# Patient Record
Sex: Female | Born: 1980
Health system: Southern US, Community
[De-identification: ages and names within clinical notes are randomized; demographics above are authoritative.]

## PROBLEM LIST (undated history)

## (undated) DIAGNOSIS — F32A Depression, unspecified: Secondary | ICD-10-CM

## (undated) DIAGNOSIS — F329 Major depressive disorder, single episode, unspecified: Secondary | ICD-10-CM

## (undated) DIAGNOSIS — R0981 Nasal congestion: Secondary | ICD-10-CM

---

## 2006-10-11 HISTORY — PX: WISDOM TOOTH EXTRACTION: SHX21

## 2014-02-15 ENCOUNTER — Encounter: Payer: Self-pay | Admitting: Physician Assistant

## 2014-02-15 ENCOUNTER — Ambulatory Visit (INDEPENDENT_AMBULATORY_CARE_PROVIDER_SITE_OTHER): Payer: BC Managed Care – PPO | Admitting: Physician Assistant

## 2014-02-15 VITALS — BP 116/79 | HR 68 | Ht 65.0 in | Wt 172.0 lb

## 2014-02-15 DIAGNOSIS — M545 Low back pain, unspecified: Secondary | ICD-10-CM

## 2014-02-15 DIAGNOSIS — R319 Hematuria, unspecified: Secondary | ICD-10-CM

## 2014-02-15 DIAGNOSIS — S39012A Strain of muscle, fascia and tendon of lower back, initial encounter: Secondary | ICD-10-CM

## 2014-02-15 LAB — POCT URINALYSIS DIPSTICK
BILIRUBIN UA: NEGATIVE
Glucose, UA: NEGATIVE
Ketones, UA: NEGATIVE
Leukocytes, UA: NEGATIVE
NITRITE UA: NEGATIVE
PH UA: 6.5
PROTEIN UA: NEGATIVE
Spec Grav, UA: 1.015
UROBILINOGEN UA: 0.2

## 2014-02-15 MED ORDER — MELOXICAM 15 MG PO TABS: 15.0000 mg | ORAL_TABLET | Freq: Every day | ORAL | Status: DC

## 2014-02-15 MED ORDER — CYCLOBENZAPRINE HCL 10 MG PO TABS: 10.0000 mg | ORAL_TABLET | Freq: Three times a day (TID) | ORAL | Status: DC | PRN

## 2014-02-15 MED ORDER — HYDROCODONE-ACETAMINOPHEN 5-300 MG PO TABS: ORAL_TABLET | ORAL | Status: DC

## 2014-02-15 MED ORDER — KETOROLAC TROMETHAMINE 60 MG/2ML IM SOLN
60.0000 mg | Freq: Once | INTRAMUSCULAR | Status: AC
  Administered 2014-02-15: 60 mg via INTRAMUSCULAR

## 2014-02-15 NOTE — Patient Instructions (Signed)

## 2014-02-15 NOTE — Progress Notes (Signed)
   Subjective:    Patient ID: Lisa Weeks, female    DOB: 11/06/1980, 33 y.o.   MRN: [id number]  HPI Pt is a 33 yo female who presents to the clinic to establish care.   PMH negative for any ongoing conditions and not on any medications.   Pt is a everyday smoker.   Approximately 7 days ago pt was playing in the floor with her legs spread wide playing with her 60 month old son. When she went to bend forward she felt a sharp pain go through her right side. It does not radiate into legs. Most of the time 4/10 pain scale except with movements. She went to [facility] next day and xrays were normal. Given steroid shot, oral prednisone and hydrocodone. 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 Worse when lifting or bending over. Better when sitting on heating pad. She has 3 boys and needs to be active.        Review of Systems  All other systems reviewed and are negative.      Objective:   Physical Exam  Constitutional: She is oriented to person, place, and time. She appears well-developed and well-nourished.  HENT:  Head: Normocephalic and atraumatic.  Cardiovascular: Normal rate, regular rhythm and normal heart sounds.   Pulmonary/Chest: Effort normal and breath sounds normal.  Musculoskeletal:  No pain over lumbar spine to palpation.  Pain and tightness with deep palpation over right lower back. ROM limited due to pain but able to perform all ROM exercises from left to right, extension and flexion.  Negative straight leg test.  Patellar reflexes 2+ and symmetric.  Strength 5/5 lower extremities.   Neurological: She is alert and oriented to person, place, and time.  Skin: Skin is dry.  Psychiatric: She has a normal mood and affect. Her behavior is normal.          Assessment & Plan:  Right low back pain-. Results for orders placed in visit on 02/15/14  URINE CULTURE      Result Value Ref Range   Colony Count 15,000 COLONIES/ML     Organism ID, Bacteria Multiple bacterial morphotypes present, none     Organism ID, Bacteria predominant. Suggest appropriate recollection if      Organism ID, Bacteria clinically indicated.    POCT URINALYSIS DIPSTICK      Result Value Ref Range   Color, UA yellow     Clarity, UA clear     Glucose, UA neg     Bilirubin, UA neg     Ketones, UA neg     Spec Grav, UA 1.015     Blood, UA trace-intact     pH, UA 6.5     Protein, UA neg     Urobilinogen, UA 0.2     Nitrite, UA neg     Leukocytes, UA Negative     Hematuria but pt is on her period. Will culture and was negative as well. Treated with low back strain exercises, mobic daily(advised to stop ibuprofen), flexeril as needed and hydrocodone for breakthrough pain. Follow up in 2-4 weeks. If not improving may need formal PT.  Pt does have yearly check ups at [facility]. She reports they have done blood work. Would like copy. If not done would like to get done here.

## 2014-02-17 LAB — URINE CULTURE: Colony Count: 15000

## 2014-03-26 ENCOUNTER — Encounter: Payer: Self-pay | Admitting: Physician Assistant

## 2014-03-26 ENCOUNTER — Ambulatory Visit (INDEPENDENT_AMBULATORY_CARE_PROVIDER_SITE_OTHER): Payer: BC Managed Care – PPO | Admitting: Physician Assistant

## 2014-03-26 VITALS — BP 122/77 | HR 74 | Temp 98.2°F | Ht 65.0 in | Wt 173.0 lb

## 2014-03-26 DIAGNOSIS — R05 Cough: Secondary | ICD-10-CM

## 2014-03-26 DIAGNOSIS — R059 Cough, unspecified: Secondary | ICD-10-CM

## 2014-03-26 DIAGNOSIS — J45909 Unspecified asthma, uncomplicated: Secondary | ICD-10-CM

## 2014-03-26 MED ORDER — ALBUTEROL SULFATE 108 (90 BASE) MCG/ACT IN AEPB: 2.0000 | INHALATION_SPRAY | RESPIRATORY_TRACT | Status: DC | PRN

## 2014-03-26 MED ORDER — ALBUTEROL SULFATE (2.5 MG/3ML) 0.083% IN NEBU
2.5000 mg | INHALATION_SOLUTION | Freq: Once | RESPIRATORY_TRACT | Status: AC
  Administered 2014-03-26: 2.5 mg via RESPIRATORY_TRACT

## 2014-03-26 MED ORDER — HYDROCODONE-HOMATROPINE 5-1.5 MG/5ML PO SYRP: 5.0000 mL | ORAL_SOLUTION | Freq: Every evening | ORAL | Status: DC | PRN

## 2014-03-26 MED ORDER — PREDNISONE 50 MG PO TABS: ORAL_TABLET | ORAL | Status: DC

## 2014-03-26 MED ORDER — AZITHROMYCIN 250 MG PO TABS: ORAL_TABLET | ORAL | Status: DC

## 2014-03-26 NOTE — Progress Notes (Signed)
   Subjective:    Patient ID: Lisa Weeks, female    DOB: 1981/07/30, 33 y.o.   MRN: [id number]  HPI Patient is a 33 year old female who presents to the clinic with 4 days of cough, chest tightness, headache. She denies hearing any overt wheezing. She does feel short of breath. She denies any sinus pressure, ear pain or sore throat. She does have some frontal head pressure. Her cough is not overtly productive and more dry but occasionally she will get up some sputum. No fever but does feel hot and fatigued. No chills, nausea or vomiting.    Review of Systems  All other systems reviewed and are negative.      Objective:   Physical Exam  Constitutional: She is oriented to person, place, and time. She appears well-developed and well-nourished.  HENT:  Head: Normocephalic and atraumatic.  Right Ear: External ear normal.  Left Ear: External ear normal.  Nose: Nose normal.  Mouth/Throat: Oropharynx is clear and moist. No oropharyngeal exudate.   TMs clear bilaterally. Negative for maxillary and frontal sinus tenderness to palpation.  Eyes: Conjunctivae are normal. Right eye exhibits no discharge. Left eye exhibits no discharge.  Neck: Normal range of motion. Neck supple.  Cardiovascular: Normal rate, regular rhythm and normal heart sounds.   Pulmonary/Chest: Effort normal and breath sounds normal.  Try to productive cough on exam today. No overt wheezing heard on exam.  However there was some diminished breath sounds. After nebulizer lungs sounded more open and air movement was better  Lymphadenopathy:    She has no cervical adenopathy.  Neurological: She is alert and oriented to person, place, and time.  Skin: Skin is dry.  Psychiatric: She has a normal mood and affect. Her behavior is normal.          Assessment & Plan:  Asthmatic bronchitis/cough-peak flows were in the yellow. Albuterol nebulizer was given with significant improvement. Patient was prescribed albuterol  nebulizer to use every 4-6 hours as needed for shortness of breath over the next couple days. Oral prednisone for the next 5 days was also given. Antibiotic that was given. Cough syrup to use at night was prescribed. If any worsening symptoms please call office.

## 2014-05-24 ENCOUNTER — Other Ambulatory Visit: Payer: Self-pay | Admitting: Physician Assistant

## 2014-05-24 ENCOUNTER — Ambulatory Visit (INDEPENDENT_AMBULATORY_CARE_PROVIDER_SITE_OTHER): Payer: BC Managed Care – PPO | Admitting: Physician Assistant

## 2014-05-24 ENCOUNTER — Encounter: Payer: Self-pay | Admitting: Physician Assistant

## 2014-05-24 VITALS — BP 111/73 | HR 64 | Ht 65.0 in | Wt 170.0 lb

## 2014-05-24 DIAGNOSIS — N760 Acute vaginitis: Secondary | ICD-10-CM | POA: Diagnosis not present

## 2014-05-24 DIAGNOSIS — B9689 Other specified bacterial agents as the cause of diseases classified elsewhere: Secondary | ICD-10-CM | POA: Diagnosis not present

## 2014-05-24 DIAGNOSIS — A499 Bacterial infection, unspecified: Secondary | ICD-10-CM

## 2014-05-24 DIAGNOSIS — N898 Other specified noninflammatory disorders of vagina: Secondary | ICD-10-CM | POA: Diagnosis not present

## 2014-05-24 LAB — WET PREP FOR TRICH, YEAST, CLUE
TRICH WET PREP: NONE SEEN
YEAST WET PREP: NONE SEEN

## 2014-05-24 MED ORDER — METRONIDAZOLE 500 MG PO TABS: 500.0000 mg | ORAL_TABLET | Freq: Two times a day (BID) | ORAL | Status: DC

## 2014-05-24 NOTE — Progress Notes (Signed)
   Subjective:    Patient ID: Lisa Weeks, female    DOB: [birth year], 33 y.o.   MRN: [id number]  HPI Patient presents to the clinic with vaginal discharge for the last 9 months since the birth of her last child. She describes the discharge as thicker and green. She occasionally will have an odor but no odor for the most part. She does use been admitted for bring for birth control. Her husband recently had a vasectomy and she will no longer need birth control. She denies any vaginal pain or itching. She denies any urinary frequency, pain, flank pain. She denies any fever or chills. She denies any nausea or vomiting. She has not had any abdominal pain or discomfort. She does not have a history of abnormal discharge.   Review of Systems  All other systems reviewed and are negative.      Objective:   Physical Exam  Constitutional: She is oriented to person, place, and time. She appears well-developed and well-nourished.  HENT:  Head: Normocephalic and atraumatic.  Cardiovascular: Normal rate, regular rhythm and normal heart sounds.   Pulmonary/Chest: Effort normal and breath sounds normal.  NO CVA tenderness.   Abdominal: Soft. Bowel sounds are normal. She exhibits no distension and no mass. There is no tenderness. There is no rebound and no guarding.  Neurological: She is alert and oriented to person, place, and time.  Psychiatric: She has a normal mood and affect. Her behavior is normal.          Assessment & Plan:  Vaginal discharge- due to longevity i do not think it is any acute infectious disease.  Will get wet prep and GC/Chlamydia. If normal increased discharge could be coming from nuva ring. Pt plans to take out after husband has sperm count done from vasectomy. Will treat other conditions pending positive testing. Follow up as needed. Pt aware that some women do have more discharge than other.   Wet prep came back positive for BV. Pt was treated. Follow up as needed or if  odor and discharge continue. There could be more than one thing effecting vaginal discharge.

## 2014-05-24 NOTE — Patient Instructions (Signed)
Bacterial Vaginosis Bacterial vaginosis is a vaginal infection that occurs when the normal balance of bacteria in the vagina is disrupted. It results from an overgrowth of certain bacteria. This is the most common vaginal infection in women of childbearing age. Treatment is important to prevent complications, especially in pregnant women, as it can cause a premature delivery. CAUSES  Bacterial vaginosis is caused by an increase in harmful bacteria that are normally present in smaller amounts in the vagina. Several different kinds of bacteria can cause bacterial vaginosis. However, the reason that the condition develops is not fully understood. RISK FACTORS Certain activities or behaviors can put you at an increased risk of developing bacterial vaginosis, including:  Having a new sex partner or multiple sex partners.  Douching.  Using an intrauterine device (IUD) for contraception. Women do not get bacterial vaginosis from toilet seats, bedding, swimming pools, or contact with objects around them. SIGNS AND SYMPTOMS  Some women with bacterial vaginosis have no signs or symptoms. Common symptoms include:  Grey vaginal discharge.  A fishlike odor with discharge, especially after sexual intercourse.  Itching or burning of the vagina and vulva.  Burning or pain with urination. DIAGNOSIS  Your health care provider will take a medical history and examine the vagina for signs of bacterial vaginosis. A sample of vaginal fluid may be taken. Your health care provider will look at this sample under a microscope to check for bacteria and abnormal cells. A vaginal pH test may also be done.  TREATMENT  Bacterial vaginosis may be treated with antibiotic medicines. These may be given in the form of a pill or a vaginal cream. A second round of antibiotics may be prescribed if the condition comes back after treatment.  HOME CARE INSTRUCTIONS   Only take over-the-counter or prescription medicines as  directed by your health care provider.  If antibiotic medicine was prescribed, take it as directed. Make sure you finish it even if you start to feel better.  Do not have sex until treatment is completed.  Tell all sexual partners that you have a vaginal infection. They should see their health care provider and be treated if they have problems, such as a mild rash or itching.  Practice safe sex by using condoms and only having one sex partner. SEEK MEDICAL CARE IF:   Your symptoms are not improving after 3 days of treatment.  You have increased discharge or pain.  You have a fever. MAKE SURE YOU:   Understand these instructions.  Will watch your condition.  Will get help right away if you are not doing well or get worse. FOR MORE INFORMATION  Centers for Disease Control and Prevention, Division of STD Prevention: www.cdc.gov/std American Sexual Health Association (ASHA): www.ashastd.org  Document Released: 09/27/2005 Document Revised: 07/18/2013 Document Reviewed: 05/09/2013 ExitCare Patient Information 2015 [employer]. This information is not intended to replace advice given to you by your health care provider. Make sure you discuss any questions you have with your health care provider.  

## 2014-05-25 LAB — GC/CHLAMYDIA PROBE AMP, URINE
CHLAMYDIA, SWAB/URINE, PCR: NEGATIVE
GC Probe Amp, Urine: NEGATIVE

## 2014-08-29 ENCOUNTER — Emergency Department (INDEPENDENT_AMBULATORY_CARE_PROVIDER_SITE_OTHER)
Admission: EM | Admit: 2014-08-29 | Discharge: 2014-08-29 | Disposition: A | Discharge status: Home / Self Care | Payer: BC Managed Care – PPO | Source: Home / Self Care | Attending: Family Medicine | Admitting: Family Medicine

## 2014-08-29 ENCOUNTER — Encounter: Payer: Self-pay | Admitting: Emergency Medicine

## 2014-08-29 DIAGNOSIS — R112 Nausea with vomiting, unspecified: Secondary | ICD-10-CM

## 2014-08-29 DIAGNOSIS — R197 Diarrhea, unspecified: Secondary | ICD-10-CM

## 2014-08-29 MED ORDER — ONDANSETRON HCL 4 MG/2ML IJ SOLN
8.0000 mg | Freq: Once | INTRAMUSCULAR | Status: AC
  Administered 2014-08-29: 8 mg via INTRAMUSCULAR

## 2014-08-29 MED ORDER — ONDANSETRON 4 MG PO TBDP
ORAL_TABLET | ORAL | Status: DC
Start: 2014-08-29 — End: 2015-06-27

## 2014-08-29 NOTE — ED Provider Notes (Signed)
CSN: [id number]     Arrival date & time 08/29/14  1712 History   First MD Initiated Contact with Patient 08/29/14 1744     Chief Complaint  Patient presents with  . Emesis      HPI Comments: Patient awoke at [place] today with abdominal cramps followed by watery diarrhea.  At about 10am she developed nausea/vomiting.  She has had fevers, chills, and sweats, and felt dizzy.  Denies recent foreign travel, or drinking untreated water in a wilderness environment.  She denies recent antibiotic use.   Patient is a 33 y.o. female presenting with vomiting. The history is provided by the patient and the spouse.  Emesis Severity:  Moderate Timing:  Intermittent Quality:  Stomach contents Able to tolerate:  Liquids Progression:  Unchanged Chronicity:  New Recent urination:  Normal Relieved by:  Nothing Worsened by:  Nothing tried Ineffective treatments: Imodium. Associated symptoms: abdominal pain, chills, diarrhea and fever   Associated symptoms: no arthralgias, no cough, no headaches, no myalgias, no sore throat and no URI   Diarrhea:    Quality:  Watery   Severity:  Moderate   Duration:  7 hours   Timing:  Intermittent   Progression:  Unchanged Risk factors: no sick contacts, no suspect food intake and no travel to endemic areas     History reviewed. No pertinent past medical history. Past Surgical History  Procedure Laterality Date  . Wisdom tooth extraction  2008   Family History  Problem Relation Age of Onset  . Cancer Mother   . Diabetes Mother   . Hyperlipidemia Mother   . Hypertension Mother   . Alcohol abuse Father   . Cancer Father   . Diabetes Father   . Hypertension Father   . Hyperlipidemia Maternal Grandmother   . Hypertension Maternal Grandmother   . Cancer Maternal Grandfather   . Cancer Paternal Grandmother   . Hyperlipidemia Paternal Grandmother   . Hypertension Paternal Grandmother   . Alcohol abuse Paternal Grandfather   . Cancer Paternal Grandfather   .  Heart attack Paternal Grandfather    History  Substance Use Topics  . Smoking status: Current Every Day Smoker  . Smokeless tobacco: Not on file  . Alcohol Use: Yes   OB History    No data available     Review of Systems  Constitutional: Positive for chills.  HENT: Negative for sore throat.   Gastrointestinal: Positive for vomiting, abdominal pain and diarrhea.  Musculoskeletal: Negative for myalgias and arthralgias.  Neurological: Negative for headaches.  All other systems reviewed and are negative.   Allergies  Review of patient's allergies indicates no known allergies.  Home Medications   Prior to Admission medications   Medication Sig Start Date End Date Taking? Authorizing Provider  etonogestrel-ethinyl estradiol (NUVARING) 0.12-0.015 MG/24HR vaginal ring Place 1 each vaginally every 28 (twenty-eight) days. Insert vaginally and leave in place for 3 consecutive weeks, then remove for 1 week.    Historical Provider, MD  metroNIDAZOLE (FLAGYL) 500 MG tablet Take 1 tablet (500 mg total) by mouth 2 (two) times daily. For 7 days. 05/24/14   Royetta Car Breeback, PA-C  ondansetron (ZOFRAN ODT) 4 MG disintegrating tablet Take one tab by mouth Q6hr prn nausea 08/29/14   Kandra Nicolas, MD   BP 111/74 mmHg  Pulse 83  Temp(Src) 98.1 F (36.7 C) (Oral)  Ht 5\' 5"  (1.651 m)  Wt 167 lb (75.751 kg)  BMI 27.79 kg/m2  SpO2 98%  LMP 08/17/2014 Physical  Exam Nursing notes and Vital Signs reviewed. Appearance:  Patient appears healthy, stated age, and in no acute distress.  She is alert and oriented.  Eyes:  Pupils are equal, round, and reactive to light and accomodation.  Extraocular movement is intact.  Conjunctivae are not inflamed  Ears:  Canals normal.  Tympanic membranes normal.  Nose:   Normal turbinates.  No sinus tenderness   Pharynx:  Normal; moist mucous membranes  Neck:  Supple.  No adenopathy Lungs:  Clear to auscultation.  Breath sounds are equal.  Heart:  Regular rate and  rhythm without murmurs, rubs, or gallops.  Abdomen:   Mild diffuse tenderness without masses or hepatosplenomegaly.  Bowel sounds are present and increased.  No CVA or flank tenderness.  Extremities:  No edema.  No calf tenderness Skin:  No rash present.   ED Course  Procedures  None               MDM   1. Nausea and vomiting, vomiting of unspecified type; suspect viral gastroenteritis   2. Diarrhea    CBC attempted; unable to obtain blood specimen. Zofran 8mg  IM.  Rx for Zofran ODT 4mg  Begin clear liquids (Pedialyte while having diarrhea) until improved, then advance to a [employer] (Bananas, Rice, Applesauce, Toast).  Then gradually resume a regular diet when tolerated.  Avoid milk products until well.  To decrease diarrhea, mix one teaspoon Citrucel (methylcellulose) in 2 oz water and drink one to three times daily.  Do not drink extra fluids with this dose and do not drink fluids for one hour afterwards.  When stools become more formed, may take Imodium (loperamide) once or twice daily to decrease stool frequency.  If symptoms become significantly worse during the night or over the weekend, proceed to the local emergency room.     Kandra Nicolas, MD 08/30/14 [phone]

## 2014-08-29 NOTE — ED Notes (Signed)
Vomiting and diarrhea started today

## 2014-08-29 NOTE — Discharge Instructions (Signed)

## 2015-01-23 ENCOUNTER — Emergency Department (INDEPENDENT_AMBULATORY_CARE_PROVIDER_SITE_OTHER): Payer: BLUE CROSS/BLUE SHIELD

## 2015-01-23 ENCOUNTER — Encounter: Payer: Self-pay | Admitting: Emergency Medicine

## 2015-01-23 ENCOUNTER — Emergency Department
Admission: EM | Admit: 2015-01-23 | Discharge: 2015-01-23 | Disposition: A | Discharge status: Home / Self Care | Payer: BLUE CROSS/BLUE SHIELD | Source: Home / Self Care | Attending: Emergency Medicine | Admitting: Emergency Medicine

## 2015-01-23 DIAGNOSIS — S022XXA Fracture of nasal bones, initial encounter for closed fracture: Secondary | ICD-10-CM | POA: Diagnosis not present

## 2015-01-23 DIAGNOSIS — J3489 Other specified disorders of nose and nasal sinuses: Secondary | ICD-10-CM | POA: Diagnosis not present

## 2015-01-23 DIAGNOSIS — S0033XA Contusion of nose, initial encounter: Secondary | ICD-10-CM | POA: Diagnosis not present
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
## 2015-01-23 NOTE — Discharge Instructions (Signed)
Contusion °A contusion is a deep bruise. Contusions are the result of an injury that caused bleeding under the skin. The contusion may turn blue, purple, or yellow. Minor injuries will give you a painless contusion, but more severe contusions may stay painful and swollen for a few weeks.  °CAUSES  °A contusion is usually caused by a blow, trauma, or direct force to an area of the body. °SYMPTOMS  °· Swelling and redness of the injured area. °· Bruising of the injured area. °· Tenderness and soreness of the injured area. °· Pain. °DIAGNOSIS  °The diagnosis can be made by taking a history and physical exam. An X-ray, CT scan, or MRI may be needed to determine if there were any associated injuries, such as fractures. °TREATMENT  °Specific treatment will depend on what area of the body was injured. In general, the best treatment for a contusion is resting, icing, elevating, and applying cold compresses to the injured area. Over-the-counter medicines may also be recommended for pain control. Ask your caregiver what the best treatment is for your contusion. °HOME CARE INSTRUCTIONS  °· Put ice on the injured area. °¨ Put ice in a plastic bag. °¨ Place a towel between your skin and the bag. °¨ Leave the ice on for 15-20 minutes, 3-4 times a day, or as directed by your health care provider. °· Only take over-the-counter or prescription medicines for pain, discomfort, or fever as directed by your caregiver. Your caregiver may recommend avoiding anti-inflammatory medicines (aspirin, ibuprofen, and naproxen) for 48 hours because these medicines may increase bruising. °· Rest the injured area. °· If possible, elevate the injured area to reduce swelling. °SEEK IMMEDIATE MEDICAL CARE IF:  °· You have increased bruising or swelling. °· You have pain that is getting worse. °· Your swelling or pain is not relieved with medicines. °MAKE SURE YOU:  °· Understand these instructions. °· Will watch your condition. °· Will get help right  away if you are not doing well or get worse. °Document Released: 07/07/2005 Document Revised: 10/02/2013 Document Reviewed: 08/02/2011 °ExitCare® Patient Information ©2015 [employer]. This information is not intended to replace advice given to you by your health care provider. Make sure you discuss any questions you have with your health care provider. ° °

## 2015-01-26 ENCOUNTER — Telehealth: Payer: Self-pay | Admitting: Emergency Medicine

## 2015-01-26 NOTE — ED Notes (Signed)
Inquired about patient's status; encourage them to call with questions/concerns.  

## 2015-06-27 ENCOUNTER — Ambulatory Visit (INDEPENDENT_AMBULATORY_CARE_PROVIDER_SITE_OTHER): Payer: BLUE CROSS/BLUE SHIELD | Admitting: Physician Assistant

## 2015-06-27 ENCOUNTER — Encounter: Payer: Self-pay | Admitting: Physician Assistant

## 2015-06-27 VITALS — BP 115/53 | HR 69 | Ht 65.0 in | Wt 168.0 lb

## 2015-06-27 DIAGNOSIS — F329 Major depressive disorder, single episode, unspecified: Secondary | ICD-10-CM | POA: Diagnosis not present

## 2015-06-27 DIAGNOSIS — F32A Depression, unspecified: Secondary | ICD-10-CM

## 2015-06-27 MED ORDER — SERTRALINE HCL 50 MG PO TABS: 50.0000 mg | ORAL_TABLET | Freq: Every day | ORAL | Status: DC

## 2015-06-27 NOTE — Progress Notes (Signed)
   Subjective:    Patient ID: Lisa Weeks, female    DOB: 1980/10/28, 33 y.o.   MRN: [id number]  HPI   Patient is a 34 year old female who presents to the clinic with worsening depression. She had to move in the last couple months from [facility] to [facility]. She moved from [place] apartment. They had to leave their church and the things they knew around here. 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 She feels very overwhelmed recently. She has no motivation. She feels like she could sleep all the time. She feels very sad and down. She feels like she has no time away from her children. Her husband is supportive but he works a lot. She does have a sister that lives with her but has her own life. She has tried Wellbutrin in the past that she never realized that it helped with her mood at all. She denies any suicidal or homicidal thoughts. She just recently joined a mom's group and had her first visit this week. She did feel like she started to form some relationships.   Review of Systems  All other systems reviewed and are negative.      Objective:   Physical Exam  Constitutional: She is oriented to person, place, and time. She appears well-developed and well-nourished.  HENT:  Head: Normocephalic and atraumatic.  Cardiovascular: Normal rate, regular rhythm and normal heart sounds.   Pulmonary/Chest: Effort normal and breath sounds normal. She has no wheezes.  Neurological: She is alert and oriented to person, place, and time.  Psychiatric:  Tearful.          Assessment & Plan:  Depression- PHQ-9 was 14. GAD-7 was 8. Patient was started on Zoloft today. Discuss with patient side effects of medication. If there is any worsening of mood when starts medication to call office. Did explain it does take some time to get in the system within 4-6 weeks. We can follow-up at that time. Encouraged patient to continue to make time for herself. Perhaps hiring a babysitter said her husband can go  out. Encouraged her to get a support group of girls with a mom's group and establish those relationships. Follow-up as needed or in 4-6 weeks.

## 2015-09-14 ENCOUNTER — Other Ambulatory Visit: Payer: Self-pay | Admitting: Physician Assistant

## 2015-09-19 ENCOUNTER — Encounter: Payer: Self-pay | Admitting: Physician Assistant

## 2015-09-19 ENCOUNTER — Ambulatory Visit (INDEPENDENT_AMBULATORY_CARE_PROVIDER_SITE_OTHER): Payer: BLUE CROSS/BLUE SHIELD | Admitting: Physician Assistant

## 2015-09-19 VITALS — BP 124/45 | HR 63 | Ht 65.0 in | Wt 169.0 lb

## 2015-09-19 DIAGNOSIS — R202 Paresthesia of skin: Secondary | ICD-10-CM | POA: Diagnosis not present

## 2015-09-19 DIAGNOSIS — R5383 Other fatigue: Secondary | ICD-10-CM | POA: Diagnosis not present

## 2015-09-19 DIAGNOSIS — R200 Anesthesia of skin: Secondary | ICD-10-CM | POA: Insufficient documentation

## 2015-09-19 DIAGNOSIS — Z23 Encounter for immunization: Secondary | ICD-10-CM | POA: Diagnosis not present

## 2015-09-19 DIAGNOSIS — F329 Major depressive disorder, single episode, unspecified: Secondary | ICD-10-CM | POA: Diagnosis not present

## 2015-09-19 DIAGNOSIS — F32A Depression, unspecified: Secondary | ICD-10-CM

## 2015-09-19 LAB — CBC
HCT: 45 % (ref 36.0–46.0)
HEMOGLOBIN: 15.1 g/dL — AB (ref 12.0–15.0)
MCH: 29.5 pg (ref 26.0–34.0)
MCHC: 33.6 g/dL (ref 30.0–36.0)
MCV: 88.1 fL (ref 78.0–100.0)
MPV: 11.1 fL (ref 8.6–12.4)
PLATELETS: 302 10*3/uL (ref 150–400)
RBC: 5.11 MIL/uL (ref 3.87–5.11)
RDW: 13.6 % (ref 11.5–15.5)
WBC: 9.2 10*3/uL (ref 4.0–10.5)

## 2015-09-19 LAB — COMPREHENSIVE METABOLIC PANEL
ALBUMIN: 4.2 g/dL (ref 3.6–5.1)
ALT: 27 U/L (ref 6–29)
AST: 19 U/L (ref 10–30)
Alkaline Phosphatase: 99 U/L (ref 33–115)
BILIRUBIN TOTAL: 0.4 mg/dL (ref 0.2–1.2)
BUN: 11 mg/dL (ref 7–25)
CHLORIDE: 104 mmol/L (ref 98–110)
CO2: 26 mmol/L (ref 20–31)
CREATININE: 0.82 mg/dL (ref 0.50–1.10)
Calcium: 9.3 mg/dL (ref 8.6–10.2)
Glucose, Bld: 92 mg/dL (ref 65–99)
Potassium: 4.3 mmol/L (ref 3.5–5.3)
SODIUM: 139 mmol/L (ref 135–146)
TOTAL PROTEIN: 6.9 g/dL (ref 6.1–8.1)

## 2015-09-19 LAB — C-REACTIVE PROTEIN: CRP: <0.5 mg/dL (ref <0.60)

## 2015-09-19 LAB — FERRITIN: FERRITIN: 59 ng/mL (ref 10–291)

## 2015-09-19 LAB — SEDIMENTATION RATE: Sed Rate: 4 mm/hr (ref 0–20)

## 2015-09-19 LAB — VITAMIN B12: VITAMIN B 12: 531 pg/mL (ref 211–911)

## 2015-09-19 LAB — TSH: TSH: 2.521 u[IU]/mL (ref 0.350–4.500)

## 2015-09-19 MED ORDER — SERTRALINE HCL 50 MG PO TABS: 50.0000 mg | ORAL_TABLET | Freq: Every day | ORAL | Status: DC

## 2015-09-19 NOTE — Progress Notes (Signed)
   Subjective:    Patient ID: Lisa Weeks, female    DOB: 20-Nov-1980, 34 y.o.   MRN: [id number]  HPI Pt is a 34 yo female who presents to the clinic for 4 week depression follow up. We started zoloft and she is doing marketly better. She is much happier, much less irritated, more calm. She still remains very tired. She admits to drinking caffieine right before bed every night. Once she gets to sleep she does stay asleep. No feelings of suicide or homicide. She denies any side effects of medication.   She also does mention left arm going numb and tingling if she lays with it extended on the cough. Goes away with position change. Also when she crosses her legs sitting on the floor with kids her bilateral legs from calf down go numb. Resolves with position changes. Happens no other time. No pain.    Review of Systems  All other systems reviewed and are negative.        Objective:   Physical Exam  Constitutional: She is oriented to person, place, and time. She appears well-developed and well-nourished.  HENT:  Head: Normocephalic and atraumatic.  Cardiovascular: Normal rate, regular rhythm and normal heart sounds.   Pulmonary/Chest: Effort normal and breath sounds normal. She has no wheezes.  Neurological: She is alert and oriented to person, place, and time.  Psychiatric: She has a normal mood and affect. Her behavior is normal.          Assessment & Plan:  Depression- PHQ-9 was 7. 5 of those points are due to fatigue and not sleeping which I do not think are depression related. Doing well stay on same dose zoloft 50mg  6 month refill given. Follow up with any changes. Add in exercise to help with symptoms.   Fatigue- will order metabolic panel. Discussed cutting out caffeine before bed. Start good sleep routine. Start melatonin up to 10mg  at bedtime. Follow up if not resolving.   Numbness and tingling- reassured pt that since positional and resolves with position changes no real  concern. If begins to happen and not resolve with positional changes follow up. Discussed weight loss could potentially help with symptoms.

## 2015-09-19 NOTE — Patient Instructions (Signed)
Sleep:try melatonin up to 10mg  daily. Good sleep routine. Decrease caffiene.

## 2015-09-20 LAB — VITAMIN D 25 HYDROXY (VIT D DEFICIENCY, FRACTURES): VIT D 25 HYDROXY: 23 ng/mL — AB (ref 30–100)

## 2016-01-06 ENCOUNTER — Encounter: Payer: Self-pay | Admitting: Physician Assistant

## 2016-01-06 ENCOUNTER — Ambulatory Visit (INDEPENDENT_AMBULATORY_CARE_PROVIDER_SITE_OTHER): Payer: BLUE CROSS/BLUE SHIELD | Admitting: Physician Assistant

## 2016-01-06 VITALS — BP 125/86 | HR 75 | Ht 65.0 in | Wt 172.0 lb

## 2016-01-06 DIAGNOSIS — K2100 Gastro-esophageal reflux disease with esophagitis, without bleeding: Secondary | ICD-10-CM

## 2016-01-06 DIAGNOSIS — K2990 Gastroduodenitis, unspecified, without bleeding: Secondary | ICD-10-CM | POA: Diagnosis not present

## 2016-01-06 DIAGNOSIS — K2970 Gastritis, unspecified, without bleeding: Secondary | ICD-10-CM | POA: Diagnosis not present

## 2016-01-06 MED ORDER — RANITIDINE HCL 150 MG PO TABS: 150.0000 mg | ORAL_TABLET | Freq: Two times a day (BID) | ORAL | Status: DC

## 2016-01-06 MED ORDER — OMEPRAZOLE 40 MG PO CPDR: 40.0000 mg | DELAYED_RELEASE_CAPSULE | Freq: Every day | ORAL | Status: DC

## 2016-01-06 NOTE — Patient Instructions (Addendum)
Food Choices for Gastroesophageal Reflux Disease, Adult  When you have gastroesophageal reflux disease (GERD), the foods you eat and your eating habits are very important. Choosing the right foods can help ease the discomfort of GERD.  WHAT GENERAL GUIDELINES DO I NEED TO FOLLOW?  · Choose fruits, vegetables, whole grains, low-fat dairy products, and low-fat meat, fish, and poultry.  · Limit fats such as oils, salad dressings, butter, nuts, and avocado.  · Keep a food diary to identify foods that cause symptoms.  · Avoid foods that cause reflux. These may be different for different people.  · Eat frequent small meals instead of three large meals each day.  · Eat your meals slowly, in a relaxed setting.  · Limit fried foods.  · Cook foods using methods other than frying.  · Avoid drinking alcohol.  · Avoid drinking large amounts of liquids with your meals.  · Avoid bending over or lying down until 2-3 hours after eating.  WHAT FOODS ARE NOT RECOMMENDED?  The following are some foods and drinks that may worsen your symptoms:  Vegetables  Tomatoes. Tomato juice. Tomato and spaghetti sauce. Chili peppers. Onion and garlic. Horseradish.  Fruits  Oranges, grapefruit, and lemon (fruit and juice).  Meats  High-fat meats, fish, and poultry. This includes hot dogs, ribs, ham, sausage, salami, and bacon.  Dairy  Whole milk and chocolate milk. Sour cream. Cream. Butter. Ice cream. Cream cheese.   Beverages  Coffee and tea, with or without caffeine. Carbonated beverages or energy drinks.  Condiments  Hot sauce. Barbecue sauce.   Sweets/Desserts  Chocolate and cocoa. Donuts. Peppermint and spearmint.  Fats and Oils  High-fat foods, including French fries and potato chips.  Other  Vinegar. Strong spices, such as black pepper, white pepper, red pepper, cayenne, curry powder, cloves, ginger, and chili powder.  The items listed above may not be a complete list of foods and beverages to avoid. Contact your dietitian for more  information.     This information is not intended to replace advice given to you by your health care provider. Make sure you discuss any questions you have with your health care provider.     Document Released: 09/27/2005 Document Revised: 10/18/2014 Document Reviewed: 08/01/2013  Elsevier Interactive Patient Education ©2016 Elsevier Inc.  Gastritis, Adult  Gastritis is soreness and swelling (inflammation) of the lining of the stomach. Gastritis can develop as a sudden onset (acute) or long-term (chronic) condition. If gastritis is not treated, it can lead to stomach bleeding and ulcers.  CAUSES   Gastritis occurs when the stomach lining is weak or damaged. Digestive juices from the stomach then inflame the weakened stomach lining. The stomach lining may be weak or damaged due to viral or bacterial infections. One common bacterial infection is the Helicobacter pylori infection. Gastritis can also result from excessive alcohol consumption, taking certain medicines, or having too much acid in the stomach.   SYMPTOMS   In some cases, there are no symptoms. When symptoms are present, they may include:  · Pain or a burning sensation in the upper abdomen.  · Nausea.  · Vomiting.  · An uncomfortable feeling of fullness after eating.  DIAGNOSIS   Your caregiver may suspect you have gastritis based on your symptoms and a physical exam. To determine the cause of your gastritis, your caregiver may perform the following:  · Blood or stool tests to check for the H pylori bacterium.  · Gastroscopy. A thin, flexible tube (endoscope) is   passed down the esophagus and into the stomach. The endoscope has a light and camera on the end. Your caregiver uses the endoscope to view the inside of the stomach.  · Taking a tissue sample (biopsy) from the stomach to examine under a microscope.  TREATMENT   Depending on the cause of your gastritis, medicines may be prescribed. If you have a bacterial infection, such as an H pylori infection,  antibiotics may be given. If your gastritis is caused by too much acid in the stomach, H2 blockers or antacids may be given. Your caregiver may recommend that you stop taking aspirin, ibuprofen, or other nonsteroidal anti-inflammatory drugs (NSAIDs).  HOME CARE INSTRUCTIONS  · Only take over-the-counter or prescription medicines as directed by your caregiver.  · If you were given antibiotic medicines, take them as directed. Finish them even if you start to feel better.  · Drink enough fluids to keep your urine clear or pale yellow.  · Avoid foods and drinks that make your symptoms worse, such as:    Caffeine or alcoholic drinks.    Chocolate.    Peppermint or mint flavorings.    Garlic and onions.    Spicy foods.    Citrus fruits, such as oranges, lemons, or limes.    Tomato-based foods such as sauce, chili, salsa, and pizza.    Fried and fatty foods.  · Eat small, frequent meals instead of large meals.  SEEK IMMEDIATE MEDICAL CARE IF:   · You have black or dark red stools.  · You vomit blood or material that looks like coffee grounds.  · You are unable to keep fluids down.  · Your abdominal pain gets worse.  · You have a fever.  · You do not feel better after 1 week.  · You have any other questions or concerns.  MAKE SURE YOU:  · Understand these instructions.  · Will watch your condition.  · Will get help right away if you are not doing well or get worse.     This information is not intended to replace advice given to you by your health care provider. Make sure you discuss any questions you have with your health care provider.     Document Released: 09/21/2001 Document Revised: 03/28/2012 Document Reviewed: 11/10/2011  Elsevier Interactive Patient Education ©2016 Elsevier Inc.

## 2016-01-06 NOTE — Progress Notes (Signed)
   Subjective:    Patient ID: Lisa Weeks, female    DOB: March 16, 1981, 35 y.o.   MRN: [id number]  HPI  Pt is a 35yo female who presents to the clinic with 2 weeks of worsening GERD symptoms. She has acid reflux but usually resolves with tums and/or zantac. Nothing is helping. She feels like her upper abdomen and chest is on fire and at times radiates to back. Laying down makes worse. Happens a few hours after eating. She is nauseated at times but no vomiting. Denies any NSAID usage. She is a daily smoker. Gets worse thoughout the day. No fever. Bowel movements are normal and denies any hematochezia or melena.    Review of Systems  All other systems reviewed and are negative.      Objective:   Physical Exam  Constitutional: She is oriented to person, place, and time. She appears well-developed and well-nourished.  HENT:  Head: Normocephalic and atraumatic.  Cardiovascular: Normal rate, regular rhythm and normal heart sounds.   Pulmonary/Chest: Effort normal and breath sounds normal.  Abdominal: Soft. Bowel sounds are normal. She exhibits no distension and no mass. There is tenderness. There is no rebound and no guarding.  Tenderness over epigastric area.  Neurological: She is alert and oriented to person, place, and time.  Psychiatric: She has a normal mood and affect. Her behavior is normal.          Assessment & Plan:  Gastritis/GERD- I do think this is acute inflammation representing some gastritis. GI cocktail given in office today. Omeprazole 40mg  daily and zantac 150mg  bid for next 8 weeks then stop. Can use zantac as needed.  GERD diet discussed.  No tenderness over RUQ and no worsening of symptoms after eating. I do not think gallbladder but if not improvement will consider [place].   Discussed smoking cessation.

## 2016-01-07 DIAGNOSIS — K2100 Gastro-esophageal reflux disease with esophagitis, without bleeding: Secondary | ICD-10-CM | POA: Insufficient documentation

## 2016-02-26 ENCOUNTER — Ambulatory Visit (INDEPENDENT_AMBULATORY_CARE_PROVIDER_SITE_OTHER): Payer: BLUE CROSS/BLUE SHIELD

## 2016-02-26 ENCOUNTER — Encounter: Payer: Self-pay | Admitting: Physician Assistant

## 2016-02-26 ENCOUNTER — Ambulatory Visit (INDEPENDENT_AMBULATORY_CARE_PROVIDER_SITE_OTHER): Payer: BLUE CROSS/BLUE SHIELD | Admitting: Physician Assistant

## 2016-02-26 VITALS — BP 119/66 | HR 62 | Ht 65.0 in | Wt 172.0 lb

## 2016-02-26 DIAGNOSIS — L509 Urticaria, unspecified: Secondary | ICD-10-CM

## 2016-02-26 DIAGNOSIS — R1013 Epigastric pain: Secondary | ICD-10-CM

## 2016-02-26 DIAGNOSIS — K8080 Other cholelithiasis without obstruction: Secondary | ICD-10-CM | POA: Diagnosis not present

## 2016-02-26 DIAGNOSIS — K8020 Calculus of gallbladder without cholecystitis without obstruction: Secondary | ICD-10-CM

## 2016-02-26 LAB — CBC WITH DIFFERENTIAL/PLATELET
BASOS PCT: 0 %
Basophils Absolute: 0 cells/uL (ref 0–200)
EOS PCT: 2 %
Eosinophils Absolute: 180 cells/uL (ref 15–500)
HCT: 44.4 % (ref 35.0–45.0)
HEMOGLOBIN: 14.8 g/dL (ref 11.7–15.5)
LYMPHS ABS: 2340 {cells}/uL (ref 850–3900)
Lymphocytes Relative: 26 %
MCH: 29.3 pg (ref 27.0–33.0)
MCHC: 33.3 g/dL (ref 32.0–36.0)
MCV: 87.9 fL (ref 80.0–100.0)
MONOS PCT: 6 %
MPV: 11.1 fL (ref 7.5–12.5)
Monocytes Absolute: 540 cells/uL (ref 200–950)
NEUTROS ABS: 5940 {cells}/uL (ref 1500–7800)
Neutrophils Relative %: 66 %
PLATELETS: 269 10*3/uL (ref 140–400)
RBC: 5.05 MIL/uL (ref 3.80–5.10)
RDW: 13.7 % (ref 11.0–15.0)
WBC: 9 10*3/uL (ref 3.8–10.8)

## 2016-02-26 LAB — COMPLETE METABOLIC PANEL WITH GFR
ALT: 24 U/L (ref 6–29)
AST: 17 U/L (ref 10–30)
Albumin: 4.2 g/dL (ref 3.6–5.1)
Alkaline Phosphatase: 83 U/L (ref 33–115)
BUN: 11 mg/dL (ref 7–25)
CO2: 27 mmol/L (ref 20–31)
Calcium: 9 mg/dL (ref 8.6–10.2)
Chloride: 104 mmol/L (ref 98–110)
Creat: 0.76 mg/dL (ref 0.50–1.10)
GFR, Est African American: >89 mL/min (ref >=60)
GFR, Est Non African American: >89 mL/min (ref >=60)
Glucose, Bld: 79 mg/dL (ref 65–99)
Potassium: 4.6 mmol/L (ref 3.5–5.3)
Sodium: 140 mmol/L (ref 135–146)
Total Bilirubin: 0.3 mg/dL (ref 0.2–1.2)
Total Protein: 6.5 g/dL (ref 6.1–8.1)

## 2016-02-26 LAB — LIPASE: Lipase: 19 U/L (ref 7–60)

## 2016-02-26 MED ORDER — METHYLPREDNISOLONE SODIUM SUCC 125 MG IJ SOLR
125.0000 mg | Freq: Once | INTRAMUSCULAR | Status: AC
Start: 2016-02-26 — End: 2016-02-26
  Administered 2016-02-26: 125 mg via INTRAMUSCULAR

## 2016-02-26 NOTE — Progress Notes (Signed)
   Subjective:    Patient ID: Lisa Weeks, female    DOB: 01/17/1981, 35 y.o.   MRN: [id number]  HPI  Pt presents to the clinic with epigastric pain that has persisted. She was seen about a month ago for similar symptoms. She was given GI cocktail and started on Zantac and Omeprazole. She felt better after 6 days and stopped meds. Symptoms started back about a week later. She restarted zantac and prilosec for last 2 weeks with no improvement. Yesterday symptoms were really bad with sharp, stinging pain in upper abdomen. Using heating pad which helps some. No vomiting. At times she does feel nauseated. No fever or chills. No melena or hematochezia. She does not notice if foods make better or worse. She just does not want to eat. When symptoms get bad she does feel pressure in her right chest area.   Last night she also noticed she broke out in hives. She took benadryl and made better. Nothing like that has happened before.      Review of Systems  All other systems reviewed and are negative.      Objective:   Physical Exam  Constitutional: She appears well-developed and well-nourished.  Obese.   Cardiovascular: Normal rate, regular rhythm and normal heart sounds.   Pulmonary/Chest: Effort normal and breath sounds normal.  Abdominal: Soft. Bowel sounds are normal. She exhibits no distension and no mass. There is tenderness. There is no rebound and no guarding.  Negative murphys sign.  Tenderness over epigastric area.  No guarding or rebound.   Skin:  Raised whelps over bilateral trunk, arms, legs.   Psychiatric: She has a normal mood and affect. Her behavior is normal.          Assessment & Plan:  Epigastric pain- negative murphys sign. Will get U/S today since symptoms have persisted. Concerned for h.pylori but not tested since been on PPI and H2. Will do a trial off medication and test via breath test.   Hives- unclear etiology. presents to the clinic before u/s with hives all  over body. Took benadryl helped. Solumedrol 125mg  given IM. Stop prilosec and zantac if causing. Will check cbc and cmp. Continue to use benadryl as needed. Discussed to go to ER with SOB, lip swelling, or trouble swallowing or breathing.

## 2016-02-27 ENCOUNTER — Encounter: Payer: Self-pay | Admitting: Physician Assistant

## 2016-02-27 ENCOUNTER — Telehealth: Payer: Self-pay | Admitting: Physician Assistant

## 2016-02-27 ENCOUNTER — Ambulatory Visit (INDEPENDENT_AMBULATORY_CARE_PROVIDER_SITE_OTHER): Payer: BLUE CROSS/BLUE SHIELD | Admitting: Physician Assistant

## 2016-02-27 VITALS — BP 123/65 | HR 88

## 2016-02-27 DIAGNOSIS — K8020 Calculus of gallbladder without cholecystitis without obstruction: Secondary | ICD-10-CM

## 2016-02-27 DIAGNOSIS — L509 Urticaria, unspecified: Secondary | ICD-10-CM

## 2016-02-27 DIAGNOSIS — R1013 Epigastric pain: Secondary | ICD-10-CM

## 2016-02-27 MED ORDER — PREDNISONE 20 MG PO TABS: ORAL_TABLET | ORAL | Status: DC

## 2016-02-27 NOTE — Addendum Note (Signed)
Addended by: Huel Cote on: 02/27/2016 09:48 AM   Modules accepted: Orders

## 2016-02-27 NOTE — Progress Notes (Signed)
   Subjective:    Patient ID: Lisa Weeks, female    DOB: April 09, 1981, 35 y.o.   MRN: [id number]  HPI Pt presents to the clinic after hives being present all day long off and on. Last night after solumedrol injection 125mg  she was hive free until 3am this morning. She woke up itching and huge hive over abdomen that soon spread over trunk and extremity. Hives are very itchy. No fever, chills, dysuria. She continues to have epigastric pain, indigestion. We did stop zantac and prilosec thinking hives could be coming from meds and in prep for h.pylori breath test. Ultrasound did show gallstone without obstruction. Referral made for general surgery. She is taking benadryl 50mg  three times a day for management of hives. Denies any new foods, creams, detergents, medications.    Review of Systems  All other systems reviewed and are negative.      Objective:   Physical Exam  Constitutional: She is oriented to person, place, and time. She appears well-developed and well-nourished.  HENT:  Head: Normocephalic and atraumatic.  Cardiovascular: Normal rate, regular rhythm and normal heart sounds.   Pulmonary/Chest: Effort normal and breath sounds normal. She has no wheezes.  Neurological: She is alert and oriented to person, place, and time.  Skin:  Raised whelps over left abdomen and up over left breast.  Scattered over bilateral arms and legs.   Psychiatric: She has a normal mood and affect. Her behavior is normal.          Assessment & Plan:  Hives- unclear etiology. Cbc and cmp was normal that were done yesterday. I have concern this could be h.pylori and presenting with skin hives. Been on PPI and cannot check with breath test. Need to send for endoscopy. Treated with prednisone taper. Continue benadryl as needed. Start zyrtec daily. If hives reoccuring with normal breath test need to consider testing thyroid, lupus and or other autoimmune  diseases.   Epigastric pain/gallstones- referred  to surgeon for removal. Referred to GI for endoscopy. Keep off PPI and H2 antagonist.

## 2016-02-27 NOTE — Telephone Encounter (Signed)
Pt states the first available [facility]t [facility] was next Thursday. Pt questions if she is waiting that long so she just come in for the breath test of still have endoscopy done. Will route to PCP for review.

## 2016-02-27 NOTE — Patient Instructions (Signed)
Hives Hives are itchy, red, swollen areas of the skin. They can vary in size and location on your body. Hives can come and go for hours or several days (acute hives) or for several weeks (chronic hives). Hives do not spread from person to person (noncontagious). They may get worse with scratching, exercise, and emotional stress. CAUSES   Allergic reaction to food, additives, or drugs.  Infections, including the common cold.  Illness, such as vasculitis, lupus, or thyroid disease.  Exposure to sunlight, heat, or cold.  Exercise.  Stress.  Contact with chemicals. SYMPTOMS   Red or white swollen patches on the skin. The patches may change size, shape, and location quickly and repeatedly.  Itching.  Swelling of the hands, feet, and face. This may occur if hives develop deeper in the skin. DIAGNOSIS  Your caregiver can usually tell what is wrong by performing a physical exam. Skin or blood tests may also be done to determine the cause of your hives. In some cases, the cause cannot be determined. TREATMENT  Mild cases usually get better with medicines such as antihistamines. Severe cases may require an emergency epinephrine injection. If the cause of your hives is known, treatment includes avoiding that trigger.  HOME CARE INSTRUCTIONS   Avoid causes that trigger your hives.  Take antihistamines as directed by your caregiver to reduce the severity of your hives. Non-sedating or low-sedating antihistamines are usually recommended. Do not drive while taking an antihistamine.  Take any other medicines prescribed for itching as directed by your caregiver.  Wear loose-fitting clothing.  Keep all follow-up appointments as directed by your caregiver. SEEK MEDICAL CARE IF:   You have persistent or severe itching that is not relieved with medicine.  You have painful or swollen joints. SEEK IMMEDIATE MEDICAL CARE IF:   You have a fever.  Your tongue or lips are swollen.  You have  trouble breathing or swallowing.  You feel tightness in the throat or chest.  You have abdominal pain. These problems may be the first sign of a life-threatening allergic reaction. Call your local emergency services (911 in [place] MAKE SURE YOU:   Understand these instructions.  Will watch your condition.  Will get help right away if you are not doing well or get worse.   This information is not intended to replace advice given to you by your health care provider. Make sure you discuss any questions you have with your health care provider.   Document Released: 09/27/2005 Document Revised: 10/02/2013 Document Reviewed: 12/21/2011 Elsevier Interactive Patient Education 2016 Elsevier Inc.  

## 2016-03-01 NOTE — Telephone Encounter (Signed)
I think you should keep [facility] appt. It will be more specific. For the best results need to be no PPI's and H2 blockers for 2 weeks. Any more hives?

## 2016-03-01 NOTE — Telephone Encounter (Signed)
Called to advise Pt of PCP recommendation, and she was able to get an appt today with [facility] so she is at their office now. No further questions.

## 2016-03-05 ENCOUNTER — Ambulatory Visit: Payer: Self-pay | Admitting: Surgery

## 2016-03-05 NOTE — H&P (Signed)
Lisa Weeks 03/05/2016 10:17 AM Location: [facility] Patient #: [id number] DOB: 1981/03/26 Married / Language: English / Race: White Female  History of Present Illness Marcello Moores A. Shivali Quackenbush MD; 03/05/2016 10:43 AM) Patient words: Patient sent at the request of Iran Planas PA-C for history of epigastric abdominal pain for the last 2-3 months. The pain is episodic lasting minutes to an hour. There is an occasional relationship to foods that may be greasy or fatty. No nausea or vomiting. The pain is sharp location his epigastrium and right upper quadrant with radiation to her back. Patient had endoscopy 2 days ago which was normal. Ultrasound was done which shows a large gallstone.                                 CLINICAL DATA: Right upper quadrant pain for 2-3 months. EXAM: ABDOMEN ULTRASOUND COMPLETE COMPARISON: None. FINDINGS: Gallbladder: Large stone identified measuring 1.1 cm. No gallbladder wall thickening or pericholecystic fluid. Common bile duct: Diameter: 1.3 mm Liver: No focal lesion identified. Within normal limits in parenchymal echogenicity. IVC: No abnormality visualized. Pancreas: Visualized portion unremarkable. Spleen: Size and appearance within normal limits. Right Kidney: Length: 9.1 cm. Echogenicity within normal limits. No mass or hydronephrosis visualized. Left Kidney: Length: 9.2 cm. Echogenicity within normal limits. No mass or hydronephrosis visualized. Abdominal aorta: No aneurysm visualized. Other findings: None. IMPRESSION: 1. No acute findings. 2. Large gallstone is noted measuring 1.1 cm. Electronically Signed By: Kerby Moors M.D. On: 02/26/2016 18:45 .  The patient is a 35 year old female.   Other Problems Shyrl Numbers, LPN; QA348G X33443 AM) Cholelithiasis Depression  Past Surgical History Shyrl Numbers, LPN; QA348G X33443 AM) Oral Surgery  Diagnostic  Studies History Shyrl Numbers, LPN; QA348G X33443 AM) Colonoscopy never Mammogram never Pap Smear 1-5 years ago  Allergies Shyrl Numbers, LPN; QA348G QA348G AM) Jilda Panda *ULCER DRUGS*  Medication History Shyrl Numbers, LPN; QA348G QA348G AM) PredniSONE (20MG  Tablet, Oral) Active. Medications Reconciled  Social History Shyrl Numbers, LPN; QA348G X33443 AM) Alcohol use Occasional alcohol use. Caffeine use Carbonated beverages, Coffee, Tea. Illicit drug use Remotely quit drug use. Tobacco use Current every day smoker.  Family History Shyrl Numbers, LPN; QA348G X33443 AM) Alcohol Abuse Father. Diabetes Mellitus Father, Mother. Hypertension Mother. Thyroid problems Mother.  Pregnancy / Birth History Shyrl Numbers, LPN; QA348G X33443 AM) Age at menarche 57 years. Contraceptive History Oral contraceptives. Gravida 3 Maternal age 64-30 Para 3 Regular periods     Review of Systems Joelene Millin F. Turpin LPN; QA348G X33443 AM) General Not Present- Appetite Loss, Chills, Fatigue, Fever, Night Sweats, Weight Gain and Weight Loss. Skin Not Present- Change in Wart/Mole, Dryness, Hives, Jaundice, New Lesions, Non-Healing Wounds, Rash and Ulcer. HEENT Present- Wears glasses/contact lenses. Not Present- Earache, Hearing Loss, Hoarseness, Nose Bleed, Oral Ulcers, Ringing in the Ears, Seasonal Allergies, Sinus Pain, Sore Throat, Visual Disturbances and Yellow Eyes. Respiratory Not Present- Bloody sputum, Chronic Cough, Difficulty Breathing, Snoring and Wheezing. Breast Not Present- Breast Mass, Breast Pain, Nipple Discharge and Skin Changes. Cardiovascular Present- Chest Pain. Not Present- Difficulty Breathing Lying Down, Leg Cramps, Palpitations, Rapid Heart Rate, Shortness of Breath and Swelling of Extremities. Gastrointestinal Not Present- Abdominal Pain, Bloating, Bloody Stool, Change in Bowel Habits, Chronic diarrhea,  Constipation, Difficulty Swallowing, Excessive gas, Gets full quickly at meals, Hemorrhoids, Indigestion, Nausea, Rectal Pain and Vomiting. Female Genitourinary Not Present- Frequency, Nocturia,  Painful Urination, Pelvic Pain and Urgency. Musculoskeletal Present- Back Pain. Not Present- Joint Pain, Joint Stiffness, Muscle Pain, Muscle Weakness and Swelling of Extremities. Neurological Not Present- Decreased Memory, Fainting, Headaches, Numbness, Seizures, Tingling, Tremor, Trouble walking and Weakness. Psychiatric Not Present- Anxiety, Bipolar, Change in Sleep Pattern, Depression, Fearful and Frequent crying. Endocrine Not Present- Cold Intolerance, Excessive Hunger, Hair Changes, Heat Intolerance, Hot flashes and New Diabetes. Hematology Not Present- Easy Bruising, Excessive bleeding, Gland problems, HIV and Persistent Infections.  Vitals Joelene Millin F. Turpin LPN; QA348G QA348G AM) 03/05/2016 10:18 AM Weight: 170.2 lb Height: 65in Body Surface Area: 1.85 m Body Mass Index: 28.32 kg/m  Temp.: 98.15F(Oral)  Pulse: 60 (Regular)  P.OX: 97% (Room air) BP: 122/78 (Sitting, Left Arm, Standard)      Physical Exam ([place] Chelf A. Lorien Shingler MD; 03/05/2016 10:43 AM)  General Mental Status-Alert. General Appearance-Consistent with stated age. Hydration-Well hydrated. Voice-Normal.  Head and Neck Head-normocephalic, atraumatic with no lesions or palpable masses.  Eye Eyeball - Bilateral-Extraocular movements intact. Sclera/Conjunctiva - Bilateral-No scleral icterus.  Chest and Lung Exam Chest and lung exam reveals -quiet, even and easy respiratory effort with no use of accessory muscles and on auscultation, normal breath sounds, no adventitious sounds and normal vocal resonance. Inspection Chest Wall - Normal. Back - normal.  Cardiovascular Cardiovascular examination reveals -on palpation PMI is normal in location and amplitude, no palpable S3 or S4. Normal  cardiac borders., normal heart sounds, regular rate and rhythm with no murmurs, carotid auscultation reveals no bruits and normal pedal pulses bilaterally.  Abdomen Inspection Inspection of the abdomen reveals - No Hernias. Skin - Scar - no surgical scars. Palpation/Percussion Palpation and Percussion of the abdomen reveal - Soft, Non Tender, No Rebound tenderness, No Rigidity (guarding) and No hepatosplenomegaly. Auscultation Auscultation of the abdomen reveals - Bowel sounds normal.  Neurologic Neurologic evaluation reveals -alert and oriented x 3 with no impairment of recent or remote memory. Mental Status-Normal.  Musculoskeletal Normal Exam - Left-Upper Extremity Strength Normal and Lower Extremity Strength Normal. Normal Exam - Right-Upper Extremity Strength Normal, Lower Extremity Weakness.    Assessment & Plan (Brendan Gadson A. Donis Pinder MD; 03/05/2016 10:45 AM)  SYMPTOMATIC CHOLELITHIASIS (K80.20) Impression: Discussed pathophysiology of gallbladder disease stones. Discussed surgical indications with the patient. Recommend laparoscopic cholecystectomy with cholangiogram.MEDICAL OPTIONS DISCUSSED WIT THE PATIENT.    The procedure has been discussed with the patient. Risks of laparoscopic cholecystectomy include bleeding, infection, bile duct injury, leak, death, open surgery, diarrhea, other surgery, organ injury, blood vessel injury, DVT, and additional care.  Current Plans You are being scheduled for surgery - Our schedulers will call you.  You should hear from our office's scheduling department within 5 working days about the location, date, and time of surgery. We try to make accommodations for patient's preferences in scheduling surgery, but sometimes the OR schedule or the surgeon's schedule prevents [place] from making those accommodations.  If you have not heard from our office [phone]) in 5 working days, call the office and ask for your surgeon's nurse.  If you  have other questions about your diagnosis, plan, or surgery, call the office and ask for your surgeon's nurse.  Pt Education - Pamphlet Given - Laparoscopic Gallbladder Surgery: discussed with patient and provided information. The anatomy & physiology of hepatobiliary & pancreatic function was discussed. The pathophysiology of gallbladder dysfunction was discussed. Natural history risks without surgery was discussed. I feel the risks of no intervention will lead to serious problems that outweigh the operative risks; therefore, I  recommended cholecystectomy to remove the pathology. I explained laparoscopic techniques with possible need for an open approach. Probable cholangiogram to evaluate the bilary tract was explained as well.  Risks such as bleeding, infection, abscess, leak, injury to other organs, need for further treatment, heart attack, death, and other risks were discussed. I noted a good likelihood this will help address the problem. Possibility that this will not correct all abdominal symptoms was explained. Goals of post-operative recovery were discussed as well. We will work to minimize complications. An educational handout further explaining the pathology and treatment options was given as well. Questions were answered. The patient expresses understanding & wishes to proceed with surgery.  Pt Education - [facility] Laparosopic Post Op HCI ([place]ross) Pt Education - Laparoscopic Cholecystectomy: gallbladder

## 2016-04-02 ENCOUNTER — Other Ambulatory Visit (HOSPITAL_COMMUNITY): Payer: Self-pay | Admitting: General Practice

## 2016-04-05 ENCOUNTER — Encounter (HOSPITAL_COMMUNITY): Payer: Self-pay

## 2016-04-05 ENCOUNTER — Inpatient Hospital Stay (HOSPITAL_COMMUNITY): Admission: RE | Admit: 2016-04-05 | Payer: BLUE CROSS/BLUE SHIELD | Source: Ambulatory Visit

## 2016-04-05 ENCOUNTER — Encounter (HOSPITAL_COMMUNITY)
Admission: RE | Admit: 2016-04-05 | Discharge: 2016-04-05 | Disposition: A | Discharge status: Home / Self Care | Payer: BLUE CROSS/BLUE SHIELD | Source: Ambulatory Visit | Attending: Surgery | Admitting: Surgery

## 2016-04-05 DIAGNOSIS — K8020 Calculus of gallbladder without cholecystitis without obstruction: Secondary | ICD-10-CM | POA: Diagnosis not present

## 2016-04-05 DIAGNOSIS — Z01812 Encounter for preprocedural laboratory examination: Secondary | ICD-10-CM | POA: Insufficient documentation

## 2016-04-05 HISTORY — DX: Major depressive disorder, single episode, unspecified: F32.9

## 2016-04-05 HISTORY — DX: Depression, unspecified: F32.A

## 2016-04-05 LAB — CBC WITH DIFFERENTIAL/PLATELET
BASOS ABS: 0.1 10*3/uL (ref 0.0–0.1)
BASOS PCT: 1 %
EOS PCT: 3 %
Eosinophils Absolute: 0.3 10*3/uL (ref 0.0–0.7)
HCT: 42.8 % (ref 36.0–46.0)
Hemoglobin: 14.2 g/dL (ref 12.0–15.0)
Lymphocytes Relative: 19 %
Lymphs Abs: 2 10*3/uL (ref 0.7–4.0)
MCH: 29.5 pg (ref 26.0–34.0)
MCHC: 33.2 g/dL (ref 30.0–36.0)
MCV: 89 fL (ref 78.0–100.0)
MONO ABS: 0.6 10*3/uL (ref 0.1–1.0)
Monocytes Relative: 6 %
NEUTROS ABS: 7.6 10*3/uL (ref 1.7–7.7)
Neutrophils Relative %: 71 %
PLATELETS: 269 10*3/uL (ref 150–400)
RBC: 4.81 MIL/uL (ref 3.87–5.11)
RDW: 13.3 % (ref 11.5–15.5)
WBC: 10.4 10*3/uL (ref 4.0–10.5)

## 2016-04-05 LAB — COMPREHENSIVE METABOLIC PANEL
ALBUMIN: 3.8 g/dL (ref 3.5–5.0)
ALT: 38 U/L (ref 14–54)
AST: 26 U/L (ref 15–41)
Alkaline Phosphatase: 94 U/L (ref 38–126)
Anion gap: 6 (ref 5–15)
BUN: 10 mg/dL (ref 6–20)
CHLORIDE: 108 mmol/L (ref 101–111)
CO2: 25 mmol/L (ref 22–32)
Calcium: 9.1 mg/dL (ref 8.9–10.3)
Creatinine, Ser: 0.91 mg/dL (ref 0.44–1.00)
GFR calc Af Amer: >60 mL/min (ref >60)
GFR calc non Af Amer: >60 mL/min (ref >60)
GLUCOSE: 104 mg/dL — AB (ref 65–99)
POTASSIUM: 4 mmol/L (ref 3.5–5.1)
Sodium: 139 mmol/L (ref 135–145)
Total Bilirubin: 0.5 mg/dL (ref 0.3–1.2)
Total Protein: 6.3 g/dL — ABNORMAL LOW (ref 6.5–8.1)

## 2016-04-05 LAB — HCG, SERUM, QUALITATIVE: PREG SERUM: NEGATIVE

## 2016-04-09 ENCOUNTER — Ambulatory Visit (INDEPENDENT_AMBULATORY_CARE_PROVIDER_SITE_OTHER): Payer: BLUE CROSS/BLUE SHIELD | Admitting: Physician Assistant

## 2016-04-09 ENCOUNTER — Encounter: Payer: Self-pay | Admitting: Physician Assistant

## 2016-04-09 VITALS — BP 104/71 | HR 59 | Ht 65.0 in | Wt 171.0 lb

## 2016-04-09 DIAGNOSIS — J069 Acute upper respiratory infection, unspecified: Secondary | ICD-10-CM

## 2016-04-09 DIAGNOSIS — R0982 Postnasal drip: Secondary | ICD-10-CM | POA: Diagnosis not present

## 2016-04-09 DIAGNOSIS — K8020 Calculus of gallbladder without cholecystitis without obstruction: Secondary | ICD-10-CM

## 2016-04-09 DIAGNOSIS — R05 Cough: Secondary | ICD-10-CM | POA: Diagnosis not present

## 2016-04-09 DIAGNOSIS — R059 Cough, unspecified: Secondary | ICD-10-CM

## 2016-04-09 MED ORDER — METHYLPREDNISOLONE SODIUM SUCC 125 MG IJ SOLR
125.0000 mg | Freq: Once | INTRAMUSCULAR | Status: AC
  Administered 2016-04-09: 125 mg via INTRAMUSCULAR

## 2016-04-09 MED ORDER — AZITHROMYCIN 250 MG PO TABS: ORAL_TABLET | ORAL | Status: DC

## 2016-04-09 MED ORDER — HYDROCOD POLST-CPM POLST ER 10-8 MG/5ML PO SUER: 5.0000 mL | Freq: Two times a day (BID) | ORAL | Status: DC | PRN

## 2016-04-09 NOTE — Progress Notes (Signed)
   Subjective:    Patient ID: Lisa Weeks, female    DOB: [birth year], 35 y.o.   MRN: [id number]  HPI Pt is a 35 yo female who presents to the clinic with cough, sinus drainage for last week. She has gallbladder surgery scheduled for next week and cannot be coughing for surgery. No fever, chills, SOB, or wheezing. Not tried anything to make better. No sick contacts. No sinus pressure.    Review of Systems  All other systems reviewed and are negative.      Objective:   Physical Exam  Constitutional: She is oriented to person, place, and time. She appears well-developed and well-nourished.  HENT:  Head: Normocephalic and atraumatic.  Right Ear: External ear normal.  Left Ear: External ear normal.  TM"s clear bilaterally.  orpharynx erythematous with PND. No exudate.  Bilateral nasal turbinates red and swollen.  No tenderness over sinuses to palpation.   Eyes: Conjunctivae are normal. Right eye exhibits no discharge. Left eye exhibits no discharge.  Neck: Normal range of motion. Neck supple.  Cardiovascular: Normal rate, regular rhythm and normal heart sounds.   Pulmonary/Chest: Effort normal and breath sounds normal. She has no wheezes.  Lymphadenopathy:    She has no cervical adenopathy.  Neurological: She is alert and oriented to person, place, and time.  Psychiatric: She has a normal mood and affect. Her behavior is normal.          Assessment & Plan:  Acute upper respiratory infection/PND/cough- appears more allergic/viral than bacterial. Due to surgery started on zpak as well. Solumedrol 125mg  IM given in office today. tussinonex given for cough at bedtime.    Gallstones- surgery planned for July 5th, 2017.

## 2016-04-12 MED ORDER — DEXTROSE 5 % IV SOLN
3.0000 g | INTRAVENOUS | Status: AC
  Administered 2016-04-14: 3 g via INTRAVENOUS
  Filled 2016-04-12: qty 3000

## 2016-04-12 MED ORDER — DEXTROSE 5 % IV SOLN
3.0000 g | INTRAVENOUS | Status: DC
  Filled 2016-04-12: qty 3000

## 2016-04-14 ENCOUNTER — Ambulatory Visit (HOSPITAL_COMMUNITY): Payer: BLUE CROSS/BLUE SHIELD

## 2016-04-14 ENCOUNTER — Ambulatory Visit (HOSPITAL_COMMUNITY): Payer: BLUE CROSS/BLUE SHIELD | Admitting: Certified Registered Nurse Anesthetist

## 2016-04-14 ENCOUNTER — Ambulatory Visit (HOSPITAL_COMMUNITY)
Admission: RE | Admit: 2016-04-14 | Discharge: 2016-04-14 | Disposition: A | Discharge status: Home / Self Care | Payer: BLUE CROSS/BLUE SHIELD | Source: Ambulatory Visit | Attending: Surgery | Admitting: Surgery

## 2016-04-14 ENCOUNTER — Encounter (HOSPITAL_COMMUNITY)
Admission: RE | Disposition: A | Discharge status: Home / Self Care | Payer: Self-pay | Source: Ambulatory Visit | Attending: Surgery

## 2016-04-14 ENCOUNTER — Encounter (HOSPITAL_COMMUNITY): Payer: Self-pay | Admitting: Surgery

## 2016-04-14 DIAGNOSIS — K8010 Calculus of gallbladder with chronic cholecystitis without obstruction: Secondary | ICD-10-CM | POA: Insufficient documentation

## 2016-04-14 DIAGNOSIS — F17200 Nicotine dependence, unspecified, uncomplicated: Secondary | ICD-10-CM | POA: Insufficient documentation

## 2016-04-14 DIAGNOSIS — K8020 Calculus of gallbladder without cholecystitis without obstruction: Secondary | ICD-10-CM | POA: Diagnosis present

## 2016-04-14 DIAGNOSIS — Z419 Encounter for procedure for purposes other than remedying health state, unspecified: Secondary | ICD-10-CM

## 2016-04-14 DIAGNOSIS — F329 Major depressive disorder, single episode, unspecified: Secondary | ICD-10-CM | POA: Insufficient documentation

## 2016-04-14 HISTORY — DX: Nasal congestion: R09.81

## 2016-04-14 HISTORY — PX: CHOLECYSTECTOMY: SHX55

## 2016-04-14 SURGERY — LAPAROSCOPIC CHOLECYSTECTOMY WITH INTRAOPERATIVE CHOLANGIOGRAM
Anesthesia: General | Site: Abdomen

## 2016-04-14 MED ORDER — PROMETHAZINE HCL 25 MG/ML IJ SOLN
6.2500 mg | Freq: Once | INTRAMUSCULAR | Status: AC
  Administered 2016-04-14: 6.25 mg via INTRAVENOUS

## 2016-04-14 MED ORDER — ACETAMINOPHEN 160 MG/5ML PO SOLN
325.0000 mg | ORAL | Status: DC | PRN
  Filled 2016-04-14: qty 20.3

## 2016-04-14 MED ORDER — FENTANYL CITRATE (PF) 100 MCG/2ML IJ SOLN
INTRAMUSCULAR | Status: AC
  Administered 2016-04-14: 50 ug via INTRAVENOUS
  Filled 2016-04-14: qty 2

## 2016-04-14 MED ORDER — PROPOFOL 10 MG/ML IV BOLUS
INTRAVENOUS | Status: AC
  Filled 2016-04-14: qty 20

## 2016-04-14 MED ORDER — IOPAMIDOL (ISOVUE-300) INJECTION 61%
INTRAVENOUS | Status: AC
  Filled 2016-04-14: qty 50

## 2016-04-14 MED ORDER — GLYCOPYRROLATE 0.2 MG/ML IV SOSY
PREFILLED_SYRINGE | INTRAVENOUS | Status: AC
  Filled 2016-04-14: qty 3

## 2016-04-14 MED ORDER — NEOSTIGMINE METHYLSULFATE 5 MG/5ML IV SOSY
PREFILLED_SYRINGE | INTRAVENOUS | Status: AC
  Filled 2016-04-14: qty 5

## 2016-04-14 MED ORDER — CELECOXIB 100 MG PO CAPS: 100.0000 mg | ORAL_CAPSULE | Freq: Two times a day (BID) | ORAL | Status: DC

## 2016-04-14 MED ORDER — ROCURONIUM BROMIDE 50 MG/5ML IV SOLN
INTRAVENOUS | Status: AC
  Filled 2016-04-14: qty 1

## 2016-04-14 MED ORDER — ROCURONIUM BROMIDE 100 MG/10ML IV SOLN
INTRAVENOUS | Status: DC | PRN
  Administered 2016-04-14: 50 mg via INTRAVENOUS

## 2016-04-14 MED ORDER — GLYCOPYRROLATE 0.2 MG/ML IJ SOLN
INTRAMUSCULAR | Status: DC | PRN
  Administered 2016-04-14: 0.6 mg via INTRAVENOUS

## 2016-04-14 MED ORDER — OXYCODONE HCL 5 MG/5ML PO SOLN: 5.0000 mg | Freq: Once | ORAL | Status: AC | PRN

## 2016-04-14 MED ORDER — HYDROMORPHONE HCL 1 MG/ML IJ SOLN
INTRAMUSCULAR | Status: AC
  Administered 2016-04-14: 0.5 mg via INTRAVENOUS
  Filled 2016-04-14: qty 1

## 2016-04-14 MED ORDER — OXYCODONE-ACETAMINOPHEN 5-325 MG PO TABS: 1.0000 | ORAL_TABLET | ORAL | Status: DC | PRN

## 2016-04-14 MED ORDER — BUPIVACAINE-EPINEPHRINE 0.25% -1:200000 IJ SOLN
INTRAMUSCULAR | Status: DC | PRN
  Administered 2016-04-14: 8 mL

## 2016-04-14 MED ORDER — FENTANYL CITRATE (PF) 100 MCG/2ML IJ SOLN
25.0000 ug | INTRAMUSCULAR | Status: DC | PRN
  Administered 2016-04-14 (×3): 50 ug via INTRAVENOUS

## 2016-04-14 MED ORDER — DEXAMETHASONE SODIUM PHOSPHATE 10 MG/ML IJ SOLN
INTRAMUSCULAR | Status: AC
  Filled 2016-04-14: qty 1

## 2016-04-14 MED ORDER — FENTANYL CITRATE (PF) 100 MCG/2ML IJ SOLN
INTRAMUSCULAR | Status: DC | PRN
  Administered 2016-04-14 (×2): 50 ug via INTRAVENOUS
  Administered 2016-04-14: 150 ug via INTRAVENOUS

## 2016-04-14 MED ORDER — NEOSTIGMINE METHYLSULFATE 10 MG/10ML IV SOLN
INTRAVENOUS | Status: DC | PRN
  Administered 2016-04-14: 4 mg via INTRAVENOUS

## 2016-04-14 MED ORDER — SODIUM CHLORIDE 0.9 % IV SOLN
INTRAVENOUS | Status: DC | PRN
  Administered 2016-04-14: 3 mL

## 2016-04-14 MED ORDER — LIDOCAINE 2% (20 MG/ML) 5 ML SYRINGE
INTRAMUSCULAR | Status: AC
  Filled 2016-04-14: qty 5

## 2016-04-14 MED ORDER — SODIUM CHLORIDE 0.9 % IR SOLN
Status: DC | PRN
  Administered 2016-04-14: 1000 mL

## 2016-04-14 MED ORDER — LIDOCAINE HCL (CARDIAC) 20 MG/ML IV SOLN
INTRAVENOUS | Status: DC | PRN
  Administered 2016-04-14: 80 mg via INTRAVENOUS

## 2016-04-14 MED ORDER — PROMETHAZINE HCL 25 MG/ML IJ SOLN
INTRAMUSCULAR | Status: AC
  Administered 2016-04-14: 6.25 mg via INTRAVENOUS
  Filled 2016-04-14: qty 1

## 2016-04-14 MED ORDER — ONDANSETRON HCL 4 MG/2ML IJ SOLN
INTRAMUSCULAR | Status: AC
  Filled 2016-04-14: qty 2

## 2016-04-14 MED ORDER — 0.9 % SODIUM CHLORIDE (POUR BTL) OPTIME
TOPICAL | Status: DC | PRN
Start: 2016-04-14 — End: 2016-04-14
  Administered 2016-04-14 (×2): 1000 mL

## 2016-04-14 MED ORDER — OXYCODONE-ACETAMINOPHEN 5-325 MG PO TABS
ORAL_TABLET | ORAL | Status: AC
  Filled 2016-04-14: qty 2

## 2016-04-14 MED ORDER — LACTATED RINGERS IV SOLN
INTRAVENOUS | Status: DC | PRN
  Administered 2016-04-14 (×2): via INTRAVENOUS

## 2016-04-14 MED ORDER — HYDROMORPHONE HCL 1 MG/ML IJ SOLN
0.5000 mg | INTRAMUSCULAR | Status: DC | PRN
  Administered 2016-04-14 (×2): 0.5 mg via INTRAVENOUS

## 2016-04-14 MED ORDER — MIDAZOLAM HCL 2 MG/2ML IJ SOLN
INTRAMUSCULAR | Status: AC
Start: 2016-04-14 — End: 2016-04-14
  Filled 2016-04-14: qty 2

## 2016-04-14 MED ORDER — HEMOSTATIC AGENTS (NO CHARGE) OPTIME
TOPICAL | Status: DC | PRN
  Administered 2016-04-14: 2 via TOPICAL

## 2016-04-14 MED ORDER — FENTANYL CITRATE (PF) 250 MCG/5ML IJ SOLN
INTRAMUSCULAR | Status: AC
  Filled 2016-04-14: qty 5

## 2016-04-14 MED ORDER — OXYCODONE-ACETAMINOPHEN 5-325 MG PO TABS
1.0000 | ORAL_TABLET | Freq: Once | ORAL | Status: AC
Start: 2016-04-14 — End: 2016-04-14
  Administered 2016-04-14: 2 via ORAL

## 2016-04-14 MED ORDER — DEXAMETHASONE SODIUM PHOSPHATE 10 MG/ML IJ SOLN
INTRAMUSCULAR | Status: DC | PRN
Start: 2016-04-14 — End: 2016-04-14
  Administered 2016-04-14: 10 mg via INTRAVENOUS

## 2016-04-14 MED ORDER — MIDAZOLAM HCL 5 MG/5ML IJ SOLN
INTRAMUSCULAR | Status: DC | PRN
  Administered 2016-04-14: 2 mg via INTRAVENOUS

## 2016-04-14 MED ORDER — ONDANSETRON HCL 4 MG/2ML IJ SOLN
INTRAMUSCULAR | Status: DC | PRN
  Administered 2016-04-14: 4 mg via INTRAVENOUS

## 2016-04-14 MED ORDER — OXYCODONE HCL 5 MG PO TABS
5.0000 mg | ORAL_TABLET | Freq: Once | ORAL | Status: AC | PRN
  Administered 2016-04-14: 5 mg via ORAL

## 2016-04-14 MED ORDER — ACETAMINOPHEN 325 MG PO TABS: 325.0000 mg | ORAL_TABLET | ORAL | Status: DC | PRN

## 2016-04-14 MED ORDER — PROPOFOL 10 MG/ML IV BOLUS
INTRAVENOUS | Status: DC | PRN
  Administered 2016-04-14: 130 mg via INTRAVENOUS
  Administered 2016-04-14: 20 mg via INTRAVENOUS

## 2016-04-14 MED ORDER — OXYCODONE HCL 5 MG PO TABS
ORAL_TABLET | ORAL | Status: AC
  Filled 2016-04-14: qty 1

## 2016-04-14 MED ORDER — BUPIVACAINE-EPINEPHRINE (PF) 0.25% -1:200000 IJ SOLN
INTRAMUSCULAR | Status: AC
  Filled 2016-04-14: qty 30

## 2016-04-14 SURGICAL SUPPLY — 38 items
APPLIER CLIP ROT 10 11.4 M/L (STAPLE) ×4
BLADE SURG ROTATE 9660 (MISCELLANEOUS) IMPLANT
CANISTER SUCTION 2500CC (MISCELLANEOUS) ×2 IMPLANT
CHLORAPREP W/TINT 26ML (MISCELLANEOUS) ×2 IMPLANT
CLIP APPLIE ROT 10 11.4 M/L (STAPLE) ×2 IMPLANT
COVER MAYO STAND STRL (DRAPES) ×2 IMPLANT
COVER SURGICAL LIGHT HANDLE (MISCELLANEOUS) ×2 IMPLANT
DRAPE C-ARM 42X72 X-RAY (DRAPES) ×2 IMPLANT
DRAPE WARM FLUID 44X44 (DRAPE) ×2 IMPLANT
ELECT REM PT RETURN 9FT ADLT (ELECTROSURGICAL) ×2
ELECTRODE REM PT RTRN 9FT ADLT (ELECTROSURGICAL) ×1 IMPLANT
GLOVE BIO SURGEON STRL SZ8 (GLOVE) ×2 IMPLANT
GLOVE BIOGEL PI IND STRL 8 (GLOVE) ×1 IMPLANT
GLOVE BIOGEL PI INDICATOR 8 (GLOVE) ×1
GOWN STRL REUS W/ TWL LRG LVL3 (GOWN DISPOSABLE) ×2 IMPLANT
GOWN STRL REUS W/ TWL XL LVL3 (GOWN DISPOSABLE) ×1 IMPLANT
GOWN STRL REUS W/TWL LRG LVL3 (GOWN DISPOSABLE) ×2
GOWN STRL REUS W/TWL XL LVL3 (GOWN DISPOSABLE) ×1
HEMOSTAT SNOW SURGICEL 2X4 (HEMOSTASIS) ×4 IMPLANT
KIT BASIN OR (CUSTOM PROCEDURE TRAY) ×2 IMPLANT
KIT ROOM TURNOVER OR (KITS) ×2 IMPLANT
LIQUID BAND (GAUZE/BANDAGES/DRESSINGS) ×2 IMPLANT
NS IRRIG 1000ML POUR BTL (IV SOLUTION) ×2 IMPLANT
PAD ARMBOARD 7.5X6 YLW CONV (MISCELLANEOUS) ×2 IMPLANT
POUCH SPECIMEN RETRIEVAL 10MM (ENDOMECHANICALS) ×2 IMPLANT
SCISSORS LAP 5X35 DISP (ENDOMECHANICALS) ×2 IMPLANT
SET CHOLANGIOGRAPH 5 50 .035 (SET/KITS/TRAYS/PACK) ×2 IMPLANT
SET IRRIG TUBING LAPAROSCOPIC (IRRIGATION / IRRIGATOR) ×2 IMPLANT
SLEEVE ENDOPATH XCEL 5M (ENDOMECHANICALS) ×2 IMPLANT
SPECIMEN JAR SMALL (MISCELLANEOUS) ×2 IMPLANT
SUT MNCRL AB 4-0 PS2 18 (SUTURE) ×2 IMPLANT
TOWEL OR 17X24 6PK STRL BLUE (TOWEL DISPOSABLE) ×2 IMPLANT
TOWEL OR 17X26 10 PK STRL BLUE (TOWEL DISPOSABLE) ×2 IMPLANT
TRAY LAPAROSCOPIC MC (CUSTOM PROCEDURE TRAY) ×2 IMPLANT
TROCAR XCEL BLUNT TIP 100MML (ENDOMECHANICALS) ×2 IMPLANT
TROCAR XCEL NON-BLD 11X100MML (ENDOMECHANICALS) ×2 IMPLANT
TROCAR XCEL NON-BLD 5MMX100MML (ENDOMECHANICALS) ×2 IMPLANT
TUBING INSUFFLATION (TUBING) ×2 IMPLANT

## 2016-04-14 NOTE — Op Note (Signed)
Laparoscopic Cholecystectomy with IOC Procedure Note  Indications: This patient presents with symptomatic gallbladder disease and will undergo laparoscopic cholecystectomy.The procedure has been discussed with the patient. Operative and non operative treatments have been discussed. Risks of surgery include bleeding, infection,  Common bile duct injury,  Injury to the stomach,liver, colon,small intestine, abdominal wall,  Diaphragm,  Major blood vessels,  And the need for an open procedure.  Other risks include worsening of medical problems, death,  DVT and pulmonary embolism, and cardiovascular events.   Medical options have also been discussed. The patient has been informed of long term expectations of surgery and non surgical options,  The patient agrees to proceed.    Pre-operative Diagnosis: Calculus of gallbladder without mention of cholecystitis or obstruction  Post-operative Diagnosis: Same  Surgeon: Nikia Mangino A.   Assistants: none   Anesthesia: General endotracheal anesthesia and Local anesthesia 0.25.% bupivacaine, with epinephrine  ASA Class: 1  Procedure Details  The patient was seen again in the Holding Room. The risks, benefits, complications, treatment options, and expected outcomes were discussed with the patient. The possibilities of reaction to medication, pulmonary aspiration, perforation of viscus, bleeding, recurrent infection, finding a normal gallbladder, the need for additional procedures, failure to diagnose a condition, the possible need to convert to an open procedure, and creating a complication requiring transfusion or operation were discussed with the patient. The patient and/or family concurred with the proposed plan, giving informed consent. The site of surgery properly noted/marked. The patient was taken to Operating Room, identified as Lisa Weeks and the procedure verified as Laparoscopic Cholecystectomy with Intraoperative Cholangiograms. A Time Out was  held and the above information confirmed.  Prior to the induction of general anesthesia, antibiotic prophylaxis was administered. General endotracheal anesthesia was then administered and tolerated well. After the induction, the abdomen was prepped in the usual sterile fashion. The patient was positioned in the supine position with the left arm comfortably tucked, along with some reverse Trendelenburg.  Local anesthetic agent was injected into the skin near the umbilicus and an incision made. The midline fascia was incised and the Hasson technique was used to introduce a 12 mm port under direct vision. It was secured with a figure of eight Vicryl suture placed in the usual fashion. Pneumoperitoneum was then created with CO2 and tolerated well without any adverse changes in the patient's vital signs. Additional trocars were introduced under direct vision with an 11 mm trocar in the epigastrium and 2 5 mm trocars in the right upper quadrant. All skin incisions were infiltrated with a local anesthetic agent before making the incision and placing the trocars.   The gallbladder was identified, the fundus grasped and retracted cephalad. Adhesions were lysed bluntly and with the electrocautery where indicated, taking care not to injure any adjacent organs or viscus. The infundibulum was grasped and retracted laterally, exposing the peritoneum overlying the triangle of Calot. This was then divided and exposed in a blunt fashion. The cystic duct was clearly identified and bluntly dissected circumferentially. The junctions of the gallbladder, cystic duct and common bile duct were clearly identified prior to the division of any linear structure.   An incision was made in the cystic duct and the cholangiogram catheter introduced. The catheter was secured using an endoclip. The study showed no stones and good visualization of the distal and proximal biliary tree. The catheter was then removed.   The cystic duct was  then  ligated with surgical clips  on the patient side and  clipped on the gallbladder side and divided. The cystic artery was identified, dissected free, ligated with clips and divided as well. Posterior cystic artery clipped and divided.  The gallbladder was dissected from the liver bed in retrograde fashion with the electrocautery. The gallbladder was removed. The liver bed was irrigated and inspected. Hemostasis was achieved with the electrocautery and Sugicel snow. Copious irrigation was utilized and was repeatedly aspirated until clear all particulate matter. Hemostasis was achieved with no signs  Of bleeding or bile leakage.  Pneumoperitoneum was completely reduced after viewing removal of the trocars under direct vision. The wound was thoroughly irrigated and the fascia was then closed with a figure of eight suture; the skin was then closed with 4 O monocryl  and a sterile dressing was applied of liquid adhesive.   Instrument, sponge, and needle counts were correct at closure and at the conclusion of the case.   Findings: Cholelithiasis  Estimated Blood Loss: Minimal         Drains: none          Total IV Fluids: 800 mL         Specimens: Gallbladder           Complications: None; patient tolerated the procedure well.         Disposition: PACU - hemodynamically stable.         Condition: stable

## 2016-04-14 NOTE — Anesthesia Postprocedure Evaluation (Signed)
Anesthesia Post Note  Patient: Benecia Aston  Procedure(s) Performed: Procedure(s) (LRB): LAPAROSCOPIC CHOLECYSTECTOMY WITH INTRAOPERATIVE CHOLANGIOGRAM (N/A)  Patient location during evaluation: PACU Anesthesia Type: General Level of consciousness: awake Pain management: pain level controlled Vital Signs Assessment: post-procedure vital signs reviewed and stable Respiratory status: spontaneous breathing Cardiovascular status: stable Postop Assessment: no signs of nausea or vomiting Anesthetic complications: no    Last Vitals:  Filed Vitals:   04/14/16 1130 04/14/16 1200  BP: 100/68 104/70  Pulse: 65   Temp:    Resp: 17     Last Pain:  Filed Vitals:   04/14/16 1203  PainSc: 7                  Kama Cammarano

## 2016-04-14 NOTE — Progress Notes (Signed)
Pt assisted up to bathroom; c/o nausea; anesthesia notified; will give phenergan per V.O.

## 2016-04-14 NOTE — Anesthesia Preprocedure Evaluation (Signed)
Anesthesia Evaluation  Patient identified by MRN, date of birth, ID band Patient awake    Reviewed: Allergy & Precautions, NPO status , Patient's Chart, lab work & pertinent test results  History of Anesthesia Complications Negative for: history of anesthetic complications  Airway Mallampati: III  TM Distance: <3 FB Neck ROM: Full    Dental  (+) Teeth Intact   Pulmonary Current Smoker,    breath sounds clear to auscultation       Cardiovascular negative cardio ROS   Rhythm:Regular     Neuro/Psych PSYCHIATRIC DISORDERS Depression negative neurological ROS     GI/Hepatic negative GI ROS, Neg liver ROS,   Endo/Other  negative endocrine ROS  Renal/GU negative Renal ROS     Musculoskeletal negative musculoskeletal ROS (+)   Abdominal   Peds  Hematology negative hematology ROS (+)   Anesthesia Other Findings   Reproductive/Obstetrics                             Anesthesia Physical Anesthesia Plan  ASA: II  Anesthesia Plan: General   Post-op Pain Management:    Induction: Intravenous  Airway Management Planned: Oral ETT  Additional Equipment: None  Intra-op Plan:   Post-operative Plan: Extubation in OR  Informed Consent: I have reviewed the patients History and Physical, chart, labs and discussed the procedure including the risks, benefits and alternatives for the proposed anesthesia with the patient or authorized representative who has indicated his/her understanding and acceptance.   Dental advisory given  Plan Discussed with: CRNA and Surgeon  Anesthesia Plan Comments:         Anesthesia Quick Evaluation

## 2016-04-14 NOTE — Discharge Instructions (Signed)
[facility] ______[facility] °LAPAROSCOPIC SURGERY: POST OP INSTRUCTIONS °Always review your discharge instruction sheet given to you by the facility where your surgery was performed. °IF YOU HAVE DISABILITY OR FAMILY LEAVE FORMS, YOU MUST BRING THEM TO THE OFFICE FOR PROCESSING.   °DO NOT GIVE THEM TO YOUR DOCTOR. ° °1. A prescription for pain medication may be given to you upon discharge.  Take your pain medication as prescribed, if needed.  If narcotic pain medicine is not needed, then you may take acetaminophen (Tylenol) or ibuprofen (Advil) as needed. °2. Take your usually prescribed medications unless otherwise directed. °3. If you need a refill on your pain medication, please contact your pharmacy.  They will contact our office to request authorization. Prescriptions will not be filled after 5pm or on week-ends. °4. You should follow a light diet the first few days after arrival home, such as soup and crackers, etc.  Be sure to include lots of fluids daily. °5. Most patients will experience some swelling and bruising in the area of the incisions.  Ice packs will help.  Swelling and bruising can take several days to resolve.  °6. It is common to experience some constipation if taking pain medication after surgery.  Increasing fluid intake and taking a stool softener (such as Colace) will usually help or prevent this problem from occurring.  A mild laxative (Milk of Magnesia or Miralax) should be taken according to package instructions if there are no bowel movements after 48 hours. °7. Unless discharge instructions indicate otherwise, you may remove your bandages 24-48 hours after surgery, and you may shower at that time.  You may have steri-strips (small skin tapes) in place directly over the incision.  These strips should be left on the skin for 7-10 days.  If your surgeon used skin glue on the incision, you may shower in 24 hours.  The glue will flake off over the next 2-3 weeks.  Any sutures or  staples will be removed at the office during your follow-up visit. °8. ACTIVITIES:  You may resume regular (light) daily activities beginning the next day--such as daily self-care, walking, climbing stairs--gradually increasing activities as tolerated.  You may have sexual intercourse when it is comfortable.  Refrain from any heavy lifting or straining until approved by your doctor. °a. You may drive when you are no longer taking prescription pain medication, you can comfortably wear a seatbelt, and you can safely maneuver your car and apply brakes. °b. RETURN TO WORK:  __________________________________________________________ °9. You should see your doctor in the office for a follow-up appointment approximately 2-3 weeks after your surgery.  Make sure that you call for this appointment within a day or two after you arrive home to insure a convenient appointment time. °10. OTHER INSTRUCTIONS: __________________________________________________________________________________________________________________________ __________________________________________________________________________________________________________________________ °WHEN TO CALL YOUR DOCTOR: °1. Fever over 101.0 °2. Inability to urinate °3. Continued bleeding from incision. °4. Increased pain, redness, or drainage from the incision. °5. Increasing abdominal pain ° °The clinic staff is available to answer your questions during regular business hours.  Please don’t hesitate to call and ask to speak to one of the nurses for clinical concerns.  If you have a medical emergency, go to the nearest emergency room or call 911.  A surgeon from [facility] is always on call at the hospital. °[address], [address], [place], [place]  [address] ? [address] [address], , [place]   [address] °[phone] ? [phone] ? FAX [phone] °Web site:   [email or web address] °

## 2016-04-14 NOTE — H&P (Signed)
H&P   Lisa Weeks (MR# [id number])      H&P Info    Author Note Status Last Update User Last Update Date/Time   Erroll Luna, MD Signed Erroll Luna, MD      H&P    Expand All Collapse All   Lisa Guiles B. Picking 03/05/2016 10:17 AM Location: [facility] Patient #: [id number] DOB: 03/24/1981 Married / Language: English / Race: White Female  History of Present Illness Lisa Moores A. Lacresha Fusilier MD; 03/05/2016 10:43 AM) Patient words: Patient sent at the request of Iran Planas PA-C for history of epigastric abdominal pain for the last 2-3 months. The pain is episodic lasting minutes to an hour. There is an occasional relationship to foods that may be greasy or fatty. No nausea or vomiting. The pain is sharp location his epigastrium and right upper quadrant with radiation to her back. Patient had endoscopy 2 days ago which was normal. Ultrasound was done which shows a large gallstone.                                 CLINICAL DATA: Right upper quadrant pain for 2-3 months. EXAM: ABDOMEN ULTRASOUND COMPLETE COMPARISON: None. FINDINGS: Gallbladder: Large stone identified measuring 1.1 cm. No gallbladder wall thickening or pericholecystic fluid. Common bile duct: Diameter: 1.3 mm Liver: No focal lesion identified. Within normal limits in parenchymal echogenicity. IVC: No abnormality visualized. Pancreas: Visualized portion unremarkable. Spleen: Size and appearance within normal limits. Right Kidney: Length: 9.1 cm. Echogenicity within normal limits. No mass or hydronephrosis visualized. Left Kidney: Length: 9.2 cm. Echogenicity within normal limits. No mass or hydronephrosis visualized. Abdominal aorta: No aneurysm visualized. Other findings: None. IMPRESSION: 1. No acute findings. 2. Large gallstone is noted measuring 1.1 cm. Electronically Signed By: Kerby Moors M.D. On: 02/26/2016 18:45 .  The patient is a 35 year  old female.   Other Problems Lisa Numbers, LPN; Cholelithiasis Depression  Past Surgical History Lisa Numbers, LPN;  Oral Surgery  Diagnostic Studies History Lisa Numbers, LPN; Colonoscopy never Mammogram never Pap Smear 1-5 years ago  Allergies Lisa Numbers, LPN;  PriLOSEC *ULCER DRUGS*  Medication History Lisa Numbers, LPN;  PredniSONE (20MG  Tablet, Oral) Active. Medications Reconciled  Social History Lisa Numbers, LPN;  Alcohol use Occasional alcohol use. Caffeine use Carbonated beverages, Coffee, Tea. Illicit drug use Remotely quit drug use. Tobacco use Current every day smoker.  Family History Lisa Numbers, LPN;  Alcohol Abuse Father. Diabetes Mellitus Father, Mother. Hypertension Mother. Thyroid problems Mother.  Pregnancy / Birth History Lisa Numbers, LPN; [id number] [id number] AM) Age at menarche 33 years. Contraceptive History Oral contraceptives. Gravida 3 Maternal age 90-30 Para 3 Regular periods     Review of Systems Lisa Millin F. Turpin LPN; General Not Present- Appetite Loss, Chills, Fatigue, Fever, Night Sweats, Weight Gain and Weight Loss. Skin Not Present- Change in Wart/Mole, Dryness, Hives, Jaundice, New Lesions, Non-Healing Wounds, Rash and Ulcer. HEENT Present- Wears glasses/contact lenses. Not Present- Earache, Hearing Loss, Hoarseness, Nose Bleed, Oral Ulcers, Ringing in the Ears, Seasonal Allergies, Sinus Pain, Sore Throat, Visual Disturbances and Yellow Eyes. Respiratory Not Present- Bloody sputum, Chronic Cough, Difficulty Breathing, Snoring and Wheezing. Breast Not Present- Breast Mass, Breast Pain, Nipple Discharge and Skin Changes. Cardiovascular Present- Chest Pain. Not Present- Difficulty Breathing Lying Down, Leg Cramps, Palpitations, Rapid Heart Rate, Shortness of Breath and Swelling of Extremities. Gastrointestinal Not Present- Abdominal Pain, Bloating, Bloody  Stool,  Change in Bowel Habits, Chronic diarrhea, Constipation, Difficulty Swallowing, Excessive gas, Gets full quickly at meals, Hemorrhoids, Indigestion, Nausea, Rectal Pain and Vomiting. Female Genitourinary Not Present- Frequency, Nocturia, Painful Urination, Pelvic Pain and Urgency. Musculoskeletal Present- Back Pain. Not Present- Joint Pain, Joint Stiffness, Muscle Pain, Muscle Weakness and Swelling of Extremities. Neurological Not Present- Decreased Memory, Fainting, Headaches, Numbness, Seizures, Tingling, Tremor, Trouble walking and Weakness. Psychiatric Not Present- Anxiety, Bipolar, Change in Sleep Pattern, Depression, Fearful and Frequent crying. Endocrine Not Present- Cold Intolerance, Excessive Hunger, Hair Changes, Heat Intolerance, Hot flashes and New Diabetes. Hematology Not Present- Easy Bruising, Excessive bleeding, Gland problems, HIV and Persistent Infections.  Vitals Lisa Millin F. Turpin LPN;  QA348G [id number] AM Weight: 170.2 lb Height: 65in Body Surface Area: 1.85 m Body Mass Index: 28.32 kg/m  Temp.: 98.31F(Oral)  Pulse: 60 (Regular)  P.OX: 97% (Room air) BP: 122/78 (Sitting, Left Arm, Standard)      Physical Exam General Mental Status-Alert. General Appearance-Consistent with stated age. Hydration-Well hydrated. Voice-Normal.  Head and Neck Head-normocephalic, atraumatic with no lesions or palpable masses.  Eye Eyeball - Bilateral-Extraocular movements intact. Sclera/Conjunctiva - Bilateral-No scleral icterus.  Chest and Lung Exam Chest and lung exam reveals -quiet, even and easy respiratory effort with no use of accessory muscles and on auscultation, normal breath sounds, no adventitious sounds and normal vocal resonance. Inspection Chest Wall - Normal. Back - normal.  Cardiovascular Cardiovascular examination reveals -on palpation PMI is normal in location and amplitude, no palpable S3 or S4. Normal cardiac borders., normal  heart sounds, regular rate and rhythm with no murmurs, carotid auscultation reveals no bruits and normal pedal pulses bilaterally.  Abdomen Inspection Inspection of the abdomen reveals - No Hernias. Skin - Scar - no surgical scars. Palpation/Percussion Palpation and Percussion of the abdomen reveal - Soft, Non Tender, No Rebound tenderness, No Rigidity (guarding) and No hepatosplenomegaly. Auscultation Auscultation of the abdomen reveals - Bowel sounds normal.  Neurologic Neurologic evaluation reveals -alert and oriented x 3 with no impairment of recent or remote memory. Mental Status-Normal.  Musculoskeletal Normal Exam - Left-Upper Extremity Strength Normal and Lower Extremity Strength Normal. Normal Exam - Right-Upper Extremity Strength Normal, Lower Extremity Weakness.    Assessment & Plan   SYMPTOMATIC CHOLELITHIASIS (K80.20) Impression: Discussed pathophysiology of gallbladder disease stones. Discussed surgical indications with the patient. Recommend laparoscopic cholecystectomy with cholangiogram.MEDICAL OPTIONS DISCUSSED WIT THE PATIENT.    The procedure has been discussed with the patient. Risks of laparoscopic cholecystectomy include bleeding, infection, bile duct injury, leak, death, open surgery, diarrhea, other surgery, organ injury, blood vessel injury, DVT, and additional care.  Current Plans You are being scheduled for surgery - Our schedulers will call you.  You should hear from our office's scheduling department within 5 working days about the location, date, and time of surgery. We try to make accommodations for patient's preferences in scheduling surgery, but sometimes the OR schedule or the surgeon's schedule prevents [place] from making those accommodations.  If you have not heard from our office [phone]) in 5 working days, call the office and ask for your surgeon's nurse.  If you have other questions about your diagnosis, plan, or surgery, call the  office and ask for your surgeon's nurse.  Pt Education - Pamphlet Given - Laparoscopic Gallbladder Surgery: discussed with patient and provided information. The anatomy & physiology of hepatobiliary & pancreatic function was discussed. The pathophysiology of gallbladder dysfunction was discussed. Natural history risks without surgery was discussed. I feel the risks  of no intervention will lead to serious problems that outweigh the operative risks; therefore, I recommended cholecystectomy to remove the pathology. I explained laparoscopic techniques with possible need for an open approach. Probable cholangiogram to evaluate the bilary tract was explained as well.  Risks such as bleeding, infection, abscess, leak, injury to other organs, need for further treatment, heart attack, death, and other risks were discussed. I noted a good likelihood this will help address the problem. Possibility that this will not correct all abdominal symptoms was explained. Goals of post-operative recovery were discussed as well. We will work to minimize complications. An educational handout further explaining the pathology and treatment options was given as well. Questions were answered. The patient expresses understanding & wishes to proceed with surgery.  Pt Education - [facility] Laparosopic Post Op HCI ([place]) Pt Education - Laparoscopic Cholecystectomy: gallbladder

## 2016-04-14 NOTE — Transfer of Care (Signed)
Immediate Anesthesia Transfer of Care Note  Patient: Lisa Weeks  Procedure(s) Performed: Procedure(s): LAPAROSCOPIC CHOLECYSTECTOMY WITH INTRAOPERATIVE CHOLANGIOGRAM (N/A)  Patient Location: PACU  Anesthesia Type:General  Level of Consciousness: patient cooperative and responds to stimulation  Airway & Oxygen Therapy: Patient Spontanous Breathing and Patient connected to nasal cannula oxygen  Post-op Assessment: Report given to RN and Post -op Vital signs reviewed and stable  Post vital signs: Reviewed and stable  Last Vitals:  Filed Vitals:   04/14/16 0654 04/14/16 1003  BP: 123/70   Pulse: 60 84  Temp: 36.6 C 36.6 C    Last Pain: There were no vitals filed for this visit.    Patients Stated Pain Goal: 3 ([id number])  Complications: No apparent anesthesia complications

## 2016-04-14 NOTE — Interval H&P Note (Signed)
History and Physical Interval Note:  04/14/2016 7:26 AM  Lisa Weeks  has presented today for surgery, with the diagnosis of Gallstones   The various methods of treatment have been discussed with the patient and family. After consideration of risks, benefits and other options for treatment, the patient has consented to  Procedure(s): LAPAROSCOPIC CHOLECYSTECTOMY WITH INTRAOPERATIVE CHOLANGIOGRAM (N/A) as a surgical intervention .  The patient's history has been reviewed, patient examined, no change in status, stable for surgery.  I have reviewed the patient's chart and labs.  The procedure has been discussed with the patient. Operative and non operative treatments have been discussed. Risks of surgery include bleeding, infection,  Common bile duct injury,  Injury to the stomach,liver, colon,small intestine, abdominal wall,  Diaphragm,  Major blood vessels,  And the need for an open procedure.  Other risks include worsening of medical problems, death,  DVT and pulmonary embolism, and cardiovascular events.   Medical options have also been discussed. The patient has been informed of long term expectations of surgery and non surgical options,  The patient agrees to proceed.  Questions were answered to the patient's satisfaction.     Ean Gettel A.

## 2016-04-15 ENCOUNTER — Encounter (HOSPITAL_COMMUNITY): Payer: Self-pay | Admitting: Surgery

## 2016-07-08 ENCOUNTER — Encounter: Payer: Self-pay | Admitting: Osteopathic Medicine

## 2016-07-08 ENCOUNTER — Ambulatory Visit (INDEPENDENT_AMBULATORY_CARE_PROVIDER_SITE_OTHER): Payer: BLUE CROSS/BLUE SHIELD | Admitting: Osteopathic Medicine

## 2016-07-08 VITALS — BP 122/70 | HR 63 | Ht 65.0 in | Wt 172.0 lb

## 2016-07-08 DIAGNOSIS — R109 Unspecified abdominal pain: Secondary | ICD-10-CM | POA: Diagnosis not present

## 2016-07-08 DIAGNOSIS — N3090 Cystitis, unspecified without hematuria: Secondary | ICD-10-CM

## 2016-07-08 LAB — POCT URINALYSIS DIPSTICK
BILIRUBIN UA: NEGATIVE
GLUCOSE UA: NEGATIVE
KETONES UA: NEGATIVE
Leukocytes, UA: NEGATIVE
Nitrite, UA: NEGATIVE
PROTEIN UA: NEGATIVE
RBC UA: NEGATIVE
SPEC GRAV UA: 1.02
UROBILINOGEN UA: 0.2
pH, UA: 6

## 2016-07-08 MED ORDER — SULFAMETHOXAZOLE-TRIMETHOPRIM 800-160 MG PO TABS: 1.0000 | ORAL_TABLET | Freq: Two times a day (BID) | ORAL | 0 refills | Status: DC

## 2016-07-08 NOTE — Patient Instructions (Addendum)
Will send urine for culture, will call with these results ASAP! Any worsening of symptoms before you hear back about the culture, please fill and take the antibiotics.

## 2016-07-08 NOTE — Progress Notes (Signed)
Chief Complaint: Possible UTI  History of Present Illness: Lisa Weeks is a 35 y.o. female who presents to [facility]  today with concerns for Chief Complaint  Patient presents with  . Abdominal Pain    Patient presents with lower abdominal cramping and slightly increased urinary frequency. No dysuria/urgency/frequency no previous urine cultures in our system positive for UTI. Patient states that occasionally when this has happened in the past she will seek care at the emergency department, is diagnosed with UTI and sent home.   Past medical, social and family history reviewed: Past Medical History:  Diagnosis Date  . Depression   . Sinus congestion    Past Surgical History:  Procedure Laterality Date  . CHOLECYSTECTOMY N/A 04/14/2016   Procedure: LAPAROSCOPIC CHOLECYSTECTOMY WITH INTRAOPERATIVE CHOLANGIOGRAM;  Surgeon: Erroll Luna, MD;  Location: [facility];  Service: General;  Laterality: N/A;  . WISDOM TOOTH EXTRACTION  2008   Social History  Substance Use Topics  . Smoking status: Current Every Day Smoker    Packs/day: 1.00    Years: 10.00  . Smokeless tobacco: Not on file  . Alcohol use Yes     Comment: occ   The patient has a family history of  No current outpatient prescriptions on file.   No current facility-administered medications for this visit.    Allergies  Allergen Reactions  . Prilosec [Omeprazole] Hives     Review of Systems: CONSTITUTIONAL: Negative fever/chills CARDIAC: No chest pain/pressure/palpitations, no orthopnea RESPIRATORY: No cough/shortness of breath/wheeze GASTROINTESTINAL: No nausea/vomiting/abdominal pain/blood in stool/diarrhea/constipation MUSCULOSKELETAL: see below re: flank pain GENITOURINARY:    Frequency: yes  Hematuria: no  Odor: no  Incontinence: no  Flank Pain: no  Vaginal bleeding/discharge: no   Exam:  BP 122/70   Pulse 63   Ht 5\' 5"  (1.651 m)   Wt 172 lb (78 kg)   BMI 28.62  kg/m  Constitutional: VSS, see above. General Appearance: alert, well-developed, well-nourished, NAD Respiratory: Normal respiratory effort. Breath sounds normal, no wheeze/rhonchi/rales Cardiovascular: S1/S2 normal, no murmur/rub/gallop auscultated. RRR Gastrointestinal: Nontender, no masses. No hepatomegaly, no splenomegaly. No hernia appreciated. Rectal exam deferred.  Musculoskeletal: Gait normal. No clubbing/cyanosis of digits. Lloyd sign Negative bilateral  Results for orders placed or performed in visit on 07/08/16 (from the past 24 hour(s))  POCT Urinalysis Dipstick     Status: None   Collection Time: 07/08/16  8:43 AM  Result Value Ref Range   Color, UA YELLOW    Clarity, UA CLEAR    Glucose, UA NEGATIVE    Bilirubin, UA NEGATIVE    Ketones, UA NEGATIVE    Spec Grav, UA 1.020    Blood, UA NEGATIVE    pH, UA 6.0    Protein, UA NEGATIVE    Urobilinogen, UA 0.2    Nitrite, UA NEGGATIVE    Leukocytes, UA Negative Negative    Previous Culture Results: no >100K cfu   ASSESSMENT/PLAN: Given lack of classic symptoms, no previous culture results to confirm that she actually did have infection or whether ER just had abnormal UA and send her home with follow-up, not convinced that we should diagnose a UTI, however culture results will not be available until 2 days from now at least. OK to write prescription for antibiotics for patient to fill if she is starting to feel worse or spreads any classic UTI symptoms prior to availability of culture, no unusual vaginal discharge but would certainly consider pelvic exam/further culture if needed. Isolated would not  want to miss a different cause of her cramping/abdominal pain if this isn't a UTI, would not want to treat UTI inappropriately when there is not one. All shins were answered, patient is agreeable to this plan, and was given printed prescription for the antibiotics  Cystitis - Plan: sulfamethoxazole-trimethoprim (BACTRIM DS) 800-160 MG  tablet  Abdominal pain, unspecified abdominal location - Plan: POCT Urinalysis Dipstick  Patient Instructions  Will send urine for culture, will call with these results ASAP! Any worsening of symptoms before you hear back about the culture, please fill and take the antibiotics.     Patient advised we will call with urine culture results once available, depending on results may need to change therapy. Return if symptoms worsen or fail to improve.

## 2016-07-14 ENCOUNTER — Other Ambulatory Visit: Payer: Self-pay | Admitting: Physician Assistant

## 2016-07-14 ENCOUNTER — Encounter: Payer: Self-pay | Admitting: Physician Assistant

## 2016-07-14 ENCOUNTER — Ambulatory Visit (INDEPENDENT_AMBULATORY_CARE_PROVIDER_SITE_OTHER): Payer: BLUE CROSS/BLUE SHIELD | Admitting: Physician Assistant

## 2016-07-14 VITALS — BP 124/67 | HR 63 | Ht 65.0 in | Wt 172.0 lb

## 2016-07-14 DIAGNOSIS — L821 Other seborrheic keratosis: Secondary | ICD-10-CM

## 2016-07-14 DIAGNOSIS — L659 Nonscarring hair loss, unspecified: Secondary | ICD-10-CM | POA: Diagnosis not present

## 2016-07-14 DIAGNOSIS — D229 Melanocytic nevi, unspecified: Secondary | ICD-10-CM

## 2016-07-14 LAB — TSH: TSH: 1.71 m[IU]/L

## 2016-07-14 NOTE — Addendum Note (Signed)
Addended by: Beatris Ship L on: 07/14/2016 02:08 PM   Modules accepted: Orders

## 2016-07-14 NOTE — Progress Notes (Addendum)
Subjective:     Patient ID: Lisa Weeks, female   DOB: July 18, 1981, 35 y.o.   MRN: [id number]  HPI The patient is a 35 y.o. Caucasian female presenting today with complaints of abnormal moles and losing hair. The patient notes that her moles are located on her back and under her right breast. The patient states that they are not painful, tender, itchy, but are just "annoying". Patient reports concern due to her mother having skin cancer. Additionally, the patient states that she noticed an increase in the amount of hair loss. The patient notes that she first noticed her hair loss a month ago. The patient denies any bald spots, recent infections, fever, fatigue, chest pain, shortness of breath, or palpitations.   Review of Systems  Constitutional: Negative for activity change, appetite change, chills, diaphoresis, fatigue, fever and unexpected weight change.  HENT: Negative.   Eyes: Negative.   Respiratory: Negative for cough, chest tightness, shortness of breath and wheezing.   Cardiovascular: Negative for chest pain, palpitations and leg swelling.  Gastrointestinal: Negative.   Endocrine: Negative.  Negative for cold intolerance and heat intolerance.  Genitourinary: Negative.   Musculoskeletal: Negative.   Skin: Negative for color change, pallor, rash and wound.  Neurological: Negative for dizziness, syncope, weakness, light-headedness, numbness and headaches.  Psychiatric/Behavioral: Negative.        Objective:   Physical Exam  Constitutional: She is oriented to person, place, and time. She appears well-developed and well-nourished. No distress.  HENT:  Head: Normocephalic and atraumatic.  Right Ear: External ear normal.  Left Ear: External ear normal.  Nose: Nose normal.  Mouth/Throat: Oropharynx is clear and moist. No oropharyngeal exudate.  Eyes: Conjunctivae and EOM are normal. Pupils are equal, round, and reactive to light. Right eye exhibits no discharge. Left eye exhibits no  discharge. No scleral icterus.  Neck: Normal range of motion. Neck supple. No JVD present. No tracheal deviation present. No thyromegaly present.  Cardiovascular: Normal rate, regular rhythm, normal heart sounds and intact distal pulses.  Exam reveals no gallop and no friction rub.   No murmur heard. Pulmonary/Chest: Effort normal and breath sounds normal. No stridor. No respiratory distress. She has no wheezes. She has no rales. She exhibits no tenderness.  Abdominal: Soft. Bowel sounds are normal. She exhibits no distension and no mass. There is no tenderness. There is no rebound and no guarding.  Lymphadenopathy:    She has no cervical adenopathy.  Neurological: She is alert and oriented to person, place, and time. No cranial nerve deficit. Coordination normal.  Skin: Skin is warm and dry. No rash noted. She is not diaphoretic. No erythema. No pallor.     Psychiatric: She has a normal mood and affect. Her behavior is normal. Judgment and thought content normal.      Assessment:     Diagnoses and all orders for this visit:  Hair loss -     TSH -     B12 -     VITAMIN D 25 Hydroxy (Vit-D Deficiency, Fractures) -     Thyroid Peroxidase Antibody  Atypical nevus      Plan:     1. Hair loss - Patient to obtain TSH, B12, vitamin D, and thyroid peroxidase secondary to family history of thyroid cancer. Patient instructed to initiate over-the-counter symptomatic treatment including Rogaine, keratin, and biotin. Will call patient with laboratory results and determine need for further medical intervention at that time.   2. Atypical nevi - Patient with two atypical  nevi on her right lower breast and back. Patient underwent shave biopsy with specimen sent to pathology. Patient tolerated procedure well. Instructed to keep the area clean and dry for at least 24 hours. Will call patient with pathology results. Patient to return-to-clinic if there is increased pain, redness, discharge, itching,  fever, or other signs of infection.  Shave Biopsy Procedure Note  Pre-operative Diagnosis: Atypical nevi  Post-operative Diagnosis: Atypical nevi and seborrhoic keratosis  Locations:right chest and lumbar region central  Indications: Atypical nevi  Anesthesia: Lidocaine 2% with epinephrine without added sodium bicarbonate  Procedure Details  History of allergy to iodine: no  Patient informed of the risks (including bleeding and infection) and benefits of the  procedure and Verbal informed consent obtained.  The lesion and surrounding area were given a sterile prep using alcohol and draped in the usual sterile fashion. A scalpel was used to shave an area of skin approximately 1cm by 1cm under right breast and 1.5cm by 1.5cm left mid back.  Hemostasis achieved with alumuninum chloride. Antibiotic ointment and a sterile dressing applied.  The specimen was sent for pathologic examination. The patient tolerated the procedure well.  EBL: trace ml  Findings: Atypical nevi and seborrheic keratosis  Condition: Stable  Complications: none.  Plan: 1. Instructed to keep the wound dry and covered for 24-48h and clean thereafter. 2. Warning signs of infection were reviewed.   3. Recommended that the patient use NSAID as needed for pain.  4. Return in as needed.

## 2016-07-15 LAB — VITAMIN D 25 HYDROXY (VIT D DEFICIENCY, FRACTURES): Vit D, 25-Hydroxy: 24 ng/mL — ABNORMAL LOW (ref 30–100)

## 2016-07-15 LAB — THYROID PEROXIDASE ANTIBODY: THYROID PEROXIDASE ANTIBODY: 232 [IU]/mL — AB (ref <9)

## 2016-07-15 LAB — VITAMIN B12: VITAMIN B 12: 498 pg/mL (ref 200–1100)

## 2016-07-18 ENCOUNTER — Encounter: Payer: Self-pay | Admitting: Physician Assistant

## 2016-07-18 DIAGNOSIS — R768 Other specified abnormal immunological findings in serum: Secondary | ICD-10-CM | POA: Insufficient documentation

## 2016-07-26 ENCOUNTER — Encounter: Payer: Self-pay | Admitting: Family Medicine

## 2016-07-26 ENCOUNTER — Ambulatory Visit (INDEPENDENT_AMBULATORY_CARE_PROVIDER_SITE_OTHER): Payer: BLUE CROSS/BLUE SHIELD | Admitting: Family Medicine

## 2016-07-26 VITALS — BP 113/74 | HR 59 | Temp 98.2°F | Ht 65.0 in | Wt 175.0 lb

## 2016-07-26 DIAGNOSIS — B9689 Other specified bacterial agents as the cause of diseases classified elsewhere: Secondary | ICD-10-CM | POA: Diagnosis not present

## 2016-07-26 DIAGNOSIS — Z23 Encounter for immunization: Secondary | ICD-10-CM | POA: Diagnosis not present

## 2016-07-26 DIAGNOSIS — J019 Acute sinusitis, unspecified: Secondary | ICD-10-CM

## 2016-07-26 MED ORDER — AMOXICILLIN-POT CLAVULANATE 875-125 MG PO TABS: 1.0000 | ORAL_TABLET | Freq: Two times a day (BID) | ORAL | 0 refills | Status: DC

## 2016-07-26 NOTE — Progress Notes (Signed)
   Subjective:    Patient ID: Lisa Weeks, female    DOB: 03/26/1981, 35 y.o.   MRN: [id number]  HPI  Healthy 35 year old female comes in today complaining of headache, sore throat, cough and sinus pressure that has been going on for 3 weeks.  Started with some ST and sinus pressure/tenderness.  Then progressed to cough.  Productive, clear.  Now has more severe nasal congestion, with green discharge.  Taking OTC tylenol cold and flu. Helped initially but no longer working.  Fullness in bilat ears.  No fever.  + chills/sweats.  Dec energy. No myalgias.  No GI sxs.   Review of Systems     Objective:   Physical Exam  Constitutional: She is oriented to person, place, and time. She appears well-developed and well-nourished.  HENT:  Head: Normocephalic and atraumatic.  Right Ear: External ear normal.  Left Ear: External ear normal.  Nose: Nose normal.  Mouth/Throat: Oropharynx is clear and moist. No oropharyngeal exudate.  Left TM and canal blocked by cerumen. Right TM and canal clear.  Eyes: Conjunctivae and EOM are normal. Pupils are equal, round, and reactive to light.  Nares are mildly erythematous.   Neck: Neck supple. No thyromegaly present.  Cardiovascular: Normal rate, regular rhythm and normal heart sounds.   Pulmonary/Chest: Effort normal and breath sounds normal. She has no wheezes.  Lymphadenopathy:    She has no cervical adenopathy.  Neurological: She is alert and oriented to person, place, and time.  Skin: Skin is warm and dry.  Psychiatric: She has a normal mood and affect.      Assessment & Plan:  Acute sinusitis - Treat with Augmentin. Call if not better in one week. Increase fluids. Okay to continue symptomatic care.

## 2016-07-26 NOTE — Patient Instructions (Addendum)

## 2016-08-20 ENCOUNTER — Encounter: Payer: Self-pay | Admitting: Family Medicine

## 2016-08-20 ENCOUNTER — Ambulatory Visit (INDEPENDENT_AMBULATORY_CARE_PROVIDER_SITE_OTHER): Payer: BLUE CROSS/BLUE SHIELD | Admitting: Family Medicine

## 2016-08-20 DIAGNOSIS — L299 Pruritus, unspecified: Secondary | ICD-10-CM

## 2016-08-20 MED ORDER — PREDNISONE 5 MG (48) PO TBPK: ORAL_TABLET | ORAL | 0 refills | Status: DC

## 2016-08-20 NOTE — Patient Instructions (Signed)
Thank you for coming in today. Take prednisone daily for 12 days as directed. Use over-the-counter Gold Bond Itch as needed. Take 1 or 2 in a drill every 4-6 hours as needed for severe itching. Call or go to the emergency room if you get worse, have trouble breathing, have chest pains, or palpitations.  Blood work today.

## 2016-08-20 NOTE — Progress Notes (Signed)
       Lisa Weeks is a 35 y.o. female who presents to [facility]: [facility] today for itching. Patient notes a one-week history of whole-body itching. She denies any new medications soaps or shampoos cosmetics clothing etc. She notes the itching does not occur with a rash. She's used Benadryl which does help temporarily. No fevers or chills vomiting or diarrhea. She feels well otherwise. She has a history of cholecystectomy a few months ago. She's never had anything like this before. Nobody at home has a similar itching situation.   Past Medical History:  Diagnosis Date  . Depression   . Sinus congestion    Past Surgical History:  Procedure Laterality Date  . CHOLECYSTECTOMY N/A 04/14/2016   Procedure: LAPAROSCOPIC CHOLECYSTECTOMY WITH INTRAOPERATIVE CHOLANGIOGRAM;  Surgeon: Erroll Luna, MD;  Location: [facility];  Service: General;  Laterality: N/A;  . WISDOM TOOTH EXTRACTION  2008   Social History  Substance Use Topics  . Smoking status: Current Every Day Smoker    Packs/day: 1.00    Years: 10.00  . Smokeless tobacco: Not on file  . Alcohol use Yes     Comment: occ   family history includes Alcohol abuse in her father and paternal grandfather; Cancer in her father, maternal grandfather, mother, paternal grandfather, and paternal grandmother; Diabetes in her father and mother; Heart attack in her paternal grandfather; Hyperlipidemia in her maternal grandmother, mother, and paternal grandmother; Hypertension in her father, maternal grandmother, mother, and paternal grandmother.  ROS as above:  Medications: Current Outpatient Prescriptions  Medication Sig Dispense Refill  . predniSONE (STERAPRED UNI-PAK 48 TAB) 5 MG (48) TBPK tablet 12 day dosepack po 48 tablet 0   No current facility-administered medications for this visit.    Allergies  Allergen Reactions  . Prilosec  [Omeprazole] Hives    Health Maintenance Health Maintenance  Topic Date Due  . HIV Screening  03/03/1997  . PAP SMEAR  03/04/2003  . TETANUS/TDAP  07/26/2026  . INFLUENZA VACCINE  Completed     Exam:  BP 128/76   Pulse 67   Ht 5\' 5"  (1.651 m)   Wt 175 lb (79.4 kg)   BMI 29.12 kg/m  Gen: Well NAD Nontoxic appearing HEENT: EOMI,  MMM no scleral icterus Lungs: Normal work of breathing. CTABL Heart: RRR no MRG Abd: NABS, Soft. Nondistended, Nontender Exts: Brisk capillary refill, warm and well perfused.  No rash   No results found for this or any previous visit (from the past 72 hour(s)). No results found.    Assessment and Plan: 35 y.o. female with pruritus. Unclear etiology. Patient has a history of cholecystectomy therefore increased bilirubin is a possibility although she does not appear to be jaundiced.  Plan to obtain basic laboratory workup listed below as well as treated empirically with prednisone Dosepak Benadryl and [place] Itch. Follow-up with PCP in the near future.  Orders Placed This Encounter  Procedures  . CBC  . COMPLETE METABOLIC PANEL WITH GFR  . TSH  . Uric acid    Discussed warning signs or symptoms. Please see discharge instructions. Patient expresses understanding.

## 2016-08-21 LAB — COMPLETE METABOLIC PANEL WITH GFR
ALT: 18 U/L (ref 6–29)
AST: 15 U/L (ref 10–30)
Albumin: 4.1 g/dL (ref 3.6–5.1)
Alkaline Phosphatase: 90 U/L (ref 33–115)
BILIRUBIN TOTAL: 0.3 mg/dL (ref 0.2–1.2)
BUN: 10 mg/dL (ref 7–25)
CO2: 23 mmol/L (ref 20–31)
Calcium: 9 mg/dL (ref 8.6–10.2)
Chloride: 105 mmol/L (ref 98–110)
Creat: 0.8 mg/dL (ref 0.50–1.10)
GLUCOSE: 85 mg/dL (ref 65–99)
POTASSIUM: 4.1 mmol/L (ref 3.5–5.3)
SODIUM: 140 mmol/L (ref 135–146)
TOTAL PROTEIN: 6.8 g/dL (ref 6.1–8.1)

## 2016-08-21 LAB — CBC
HEMATOCRIT: 44.7 % (ref 35.0–45.0)
HEMOGLOBIN: 14.6 g/dL (ref 11.7–15.5)
MCH: 29.4 pg (ref 27.0–33.0)
MCHC: 32.7 g/dL (ref 32.0–36.0)
MCV: 89.9 fL (ref 80.0–100.0)
MPV: 10.9 fL (ref 7.5–12.5)
Platelets: 290 10*3/uL (ref 140–400)
RBC: 4.97 MIL/uL (ref 3.80–5.10)
RDW: 13.4 % (ref 11.0–15.0)
WBC: 11 10*3/uL — AB (ref 3.8–10.8)

## 2016-08-21 LAB — TSH: TSH: 2.28 mIU/L

## 2016-08-21 LAB — URIC ACID: URIC ACID, SERUM: 4.3 mg/dL (ref 2.5–7.0)

## 2016-09-08 ENCOUNTER — Encounter (HOSPITAL_BASED_OUTPATIENT_CLINIC_OR_DEPARTMENT_OTHER): Payer: Self-pay | Admitting: *Deleted

## 2016-09-08 ENCOUNTER — Emergency Department (HOSPITAL_BASED_OUTPATIENT_CLINIC_OR_DEPARTMENT_OTHER): Payer: BLUE CROSS/BLUE SHIELD

## 2016-09-08 ENCOUNTER — Emergency Department (HOSPITAL_BASED_OUTPATIENT_CLINIC_OR_DEPARTMENT_OTHER)
Admission: EM | Admit: 2016-09-08 | Discharge: 2016-09-08 | Disposition: A | Discharge status: Home / Self Care | Payer: BLUE CROSS/BLUE SHIELD | Attending: Emergency Medicine | Admitting: Emergency Medicine

## 2016-09-08 DIAGNOSIS — F17200 Nicotine dependence, unspecified, uncomplicated: Secondary | ICD-10-CM | POA: Diagnosis not present

## 2016-09-08 DIAGNOSIS — R079 Chest pain, unspecified: Secondary | ICD-10-CM | POA: Insufficient documentation

## 2016-09-08 LAB — COMPREHENSIVE METABOLIC PANEL
ALBUMIN: 3.7 g/dL (ref 3.5–5.0)
ALT: 34 U/L (ref 14–54)
AST: 20 U/L (ref 15–41)
Alkaline Phosphatase: 75 U/L (ref 38–126)
Anion gap: 8 (ref 5–15)
BILIRUBIN TOTAL: 0.3 mg/dL (ref 0.3–1.2)
BUN: 11 mg/dL (ref 6–20)
CHLORIDE: 105 mmol/L (ref 101–111)
CO2: 25 mmol/L (ref 22–32)
CREATININE: 0.77 mg/dL (ref 0.44–1.00)
Calcium: 9.1 mg/dL (ref 8.9–10.3)
GFR calc Af Amer: >60 mL/min (ref >60)
GLUCOSE: 110 mg/dL — AB (ref 65–99)
POTASSIUM: 4 mmol/L (ref 3.5–5.1)
Sodium: 138 mmol/L (ref 135–145)
Total Protein: 6.5 g/dL (ref 6.5–8.1)

## 2016-09-08 LAB — CBC WITH DIFFERENTIAL/PLATELET
Basophils Absolute: 0.1 10*3/uL (ref 0.0–0.1)
Basophils Relative: 1 %
EOS ABS: 0.4 10*3/uL (ref 0.0–0.7)
EOS PCT: 3 %
HCT: 41.2 % (ref 36.0–46.0)
Hemoglobin: 13.7 g/dL (ref 12.0–15.0)
LYMPHS ABS: 3.9 10*3/uL (ref 0.7–4.0)
LYMPHS PCT: 33 %
MCH: 29.8 pg (ref 26.0–34.0)
MCHC: 33.3 g/dL (ref 30.0–36.0)
MCV: 89.8 fL (ref 78.0–100.0)
MONO ABS: 0.8 10*3/uL (ref 0.1–1.0)
Monocytes Relative: 7 %
Neutro Abs: 6.6 10*3/uL (ref 1.7–7.7)
Neutrophils Relative %: 56 %
PLATELETS: 239 10*3/uL (ref 150–400)
RBC: 4.59 MIL/uL (ref 3.87–5.11)
RDW: 13.4 % (ref 11.5–15.5)
WBC: 11.8 10*3/uL — AB (ref 4.0–10.5)

## 2016-09-08 LAB — TROPONIN I: Troponin I: <0.03 ng/mL (ref <0.03)

## 2016-09-08 LAB — LIPASE, BLOOD: LIPASE: 22 U/L (ref 11–51)

## 2016-09-08 MED ORDER — GI COCKTAIL ~~LOC~~
30.0000 mL | Freq: Once | ORAL | Status: AC
  Administered 2016-09-08: 30 mL via ORAL
  Filled 2016-09-08: qty 30

## 2016-09-08 MED ORDER — RANITIDINE HCL 150 MG PO CAPS: 150.0000 mg | ORAL_CAPSULE | Freq: Every day | ORAL | 0 refills | Status: DC

## 2016-09-08 MED ORDER — HYDROXYZINE HCL 25 MG PO TABS: 25.0000 mg | ORAL_TABLET | Freq: Four times a day (QID) | ORAL | 0 refills | Status: DC

## 2016-09-08 MED ORDER — SUCRALFATE 1 GM/10ML PO SUSP: 1.0000 g | Freq: Three times a day (TID) | ORAL | 0 refills | Status: DC

## 2016-09-08 NOTE — Discharge Instructions (Signed)

## 2016-09-08 NOTE — ED Notes (Addendum)
States has had chest pain that radiates to back off and on x 2 days. Denies at present, prior episodes of pain in July before getting a cholecystectomy. Took prilosec without relief. Denies SOB, no apparent distress noted

## 2016-09-08 NOTE — ED Notes (Signed)
ED Provider at bedside. 

## 2016-09-08 NOTE — ED Notes (Signed)
Patient denies pain and is resting comfortably.  

## 2016-09-08 NOTE — ED Provider Notes (Signed)
Emergency Department Provider Note   I have reviewed the triage vital signs and the nursing notes.   HISTORY  Chief Complaint Chest Pain   HPI Lisa Weeks is a 35 y.o. female with PMH of depression presents to the ED for evaluation of right sided chest pain for the last 2 weeks with nausea and total body itching. The patient reports that this feels similar to right before she had her gallbladder removed. The pain is in the right chest and not worsened with deep breathing or exertion. No productive cough. Denies rash or abdominal pain. No nausea or vomiting. Has tried OTC medications for pain with no relief.   Past Medical History:  Diagnosis Date  . Depression   . Sinus congestion     Patient Active Problem List   Diagnosis Date Noted  . Itching 08/20/2016  . Anti-TPO antibodies present 07/18/2016  . Hair loss 07/14/2016  . Seborrheic keratosis 07/14/2016  . Gallstones without obstruction of gallbladder 02/27/2016  . Gastroesophageal reflux disease with esophagitis 01/07/2016  . Numbness and tingling 09/19/2015  . Other fatigue 09/19/2015  . Depression 06/27/2015    Past Surgical History:  Procedure Laterality Date  . CHOLECYSTECTOMY N/A 04/14/2016   Procedure: LAPAROSCOPIC CHOLECYSTECTOMY WITH INTRAOPERATIVE CHOLANGIOGRAM;  Surgeon: Erroll Luna, MD;  Location: [facility];  Service: General;  Laterality: N/A;  . WISDOM TOOTH EXTRACTION  2008      Allergies Prilosec [omeprazole]  Family History  Problem Relation Age of Onset  . Cancer Mother   . Diabetes Mother   . Hyperlipidemia Mother   . Hypertension Mother   . Alcohol abuse Father   . Cancer Father   . Diabetes Father   . Hypertension Father   . Hyperlipidemia Maternal Grandmother   . Hypertension Maternal Grandmother   . Cancer Maternal Grandfather   . Cancer Paternal Grandmother   . Hyperlipidemia Paternal Grandmother   . Hypertension Paternal Grandmother   . Alcohol abuse Paternal Grandfather     . Cancer Paternal Grandfather   . Heart attack Paternal Grandfather     Social History Social History  Substance Use Topics  . Smoking status: Current Every Day Smoker    Packs/day: 1.00    Years: 10.00  . Smokeless tobacco: Not on file  . Alcohol use Yes     Comment: occ    Review of Systems  Constitutional: No fever/chills. Positive total body itching.  Eyes: No visual changes. ENT: No sore throat. Cardiovascular: Positive chest pain. Respiratory: Denies shortness of breath. Gastrointestinal: No abdominal pain.  No nausea, no vomiting.  No diarrhea.  No constipation. Genitourinary: Negative for dysuria. Musculoskeletal: Negative for back pain. Skin: Negative for rash. Neurological: Negative for headaches, focal weakness or numbness.  10-point ROS otherwise negative.  ____________________________________________   PHYSICAL EXAM:  VITAL SIGNS: ED Triage Vitals  Enc Vitals Group     BP 09/08/16 1905 142/88     Pulse Rate 09/08/16 1905 100     Resp 09/08/16 1905 18     Temp 09/08/16 1905 98.7 F (37.1 C)     Temp src --      SpO2 09/08/16 1905 100 %     Weight 09/08/16 1903 175 lb (79.4 kg)     Height 09/08/16 1903 5\' 5"  (1.651 m)     Pain Score 09/08/16 1903 2   Constitutional: Alert and oriented. Well appearing and in no acute distress. Eyes: Conjunctivae are normal.  Head: Atraumatic. Nose: No congestion/rhinnorhea. Mouth/Throat: Mucous membranes  are moist.  Oropharynx non-erythematous. Neck: No stridor.  Cardiovascular: Normal rate, regular rhythm. Good peripheral circulation. Grossly normal heart sounds.   Respiratory: Normal respiratory effort.  No retractions. Lungs CTAB. Gastrointestinal: Soft and nontender. No distention. Musculoskeletal: No lower extremity tenderness nor edema. No gross deformities of extremities. Neurologic:  Normal speech and language. No gross focal neurologic deficits are appreciated.  Skin:  Skin is warm, dry and intact. No  rash noted.  ____________________________________________   LABS (all labs ordered are listed, but only abnormal results are displayed)  Labs Reviewed  COMPREHENSIVE METABOLIC PANEL - Abnormal; Notable for the following:       Result Value   Glucose, Bld 110 (*)    All other components within normal limits  CBC WITH DIFFERENTIAL/PLATELET - Abnormal; Notable for the following:    WBC 11.8 (*)    All other components within normal limits  LIPASE, BLOOD  TROPONIN I   ____________________________________________  EKG   EKG Interpretation  Date/Time:  Wednesday September 08 2016 19:07:06 EST Ventricular Rate:  69 PR Interval:  170 QRS Duration: 72 QT Interval:  382 QTC Calculation: 409 R Axis:   52 Text Interpretation:  Normal sinus rhythm Low voltage QRS Borderline ECG No STEMI.  Confirmed by Kevron Patella MD, Maze Corniel [phone]) on 09/08/2016 7:13:06 PM Also confirmed by Sherryl Valido MD, Inetha Maret [phone]), editor [place]  CCT, [place] ([id number])  on 09/09/2016 6:59:26 AM       ____________________________________________  RADIOLOGY  Dg Chest 2 View  Result Date: 09/08/2016 CLINICAL DATA:  Acute onset of mid sternal chest pain, radiating to the back. Initial encounter. EXAM: CHEST  2 VIEW COMPARISON:  None. FINDINGS: The lungs are well-aerated and clear. There is no evidence of focal opacification, pleural effusion or pneumothorax. The heart is normal in size; the mediastinal contour is within normal limits. No acute osseous abnormalities are seen. IMPRESSION: No acute cardiopulmonary process seen. Electronically Signed   By: Garald Balding M.D.   On: 09/08/2016 22:18    ____________________________________________   PROCEDURES  Procedure(s) performed:   Procedures  None ____________________________________________   INITIAL IMPRESSION / ASSESSMENT AND PLAN / ED COURSE  Pertinent labs & imaging results that were available during my care of the patient were reviewed by me and considered  in my medical decision making (see chart for details).  Patient presents to the ED for evaluation of right sided chest pain for the last 2 weeks. She has associated total body itching and feels similar to prior gallbladder disease. She has since had the gallbladder removed. PERC negative. Low risk for MACE by HEART score in the next 6 weeks. Plan for single troponin, CMP, CBC, and CXR.   Patient feeling better after GI cocktail. Troponin negative. EKG unremarkable. Discussed smoking cessation with the patient.   At this time, I do not feel there is any life-threatening condition present. I have reviewed and discussed all results (EKG, imaging, lab, urine as appropriate), exam findings with patient. I have reviewed nursing notes and appropriate previous records.  I feel the patient is safe to be discharged home without further emergent workup. Discussed usual and customary return precautions. Patient and family (if present) verbalize understanding and are comfortable with this plan.  Patient will follow-up with their primary care provider. If they do not have a primary care provider, information for follow-up has been provided to them. All questions have been answered.  ____________________________________________  FINAL CLINICAL IMPRESSION(S) / ED DIAGNOSES  Final diagnoses:  Nonspecific chest pain  MEDICATIONS GIVEN DURING THIS VISIT:  Medications  gi cocktail (Maalox,Lidocaine,Donnatal) (30 mLs Oral Given 09/08/16 2200)     NEW OUTPATIENT MEDICATIONS STARTED DURING THIS VISIT:  Discharge Medication List as of 09/08/2016 10:59 PM    START taking these medications   Details  hydrOXYzine (ATARAX/VISTARIL) 25 MG tablet Take 1 tablet (25 mg total) by mouth every 6 (six) hours., Starting Wed 09/08/2016, Print    ranitidine (ZANTAC) 150 MG capsule Take 1 capsule (150 mg total) by mouth daily., Starting Wed 09/08/2016, Print    sucralfate (CARAFATE) 1 GM/10ML suspension Take 10 mLs (1  g total) by mouth 4 (four) times daily -  with meals and at bedtime., Starting Wed 09/08/2016, Print        Note:  This document was prepared using Dragon voice recognition software and may include unintentional dictation errors.  Nanda Quinton, MD Emergency Medicine   Margette Fast, MD 09/09/16 1006

## 2016-09-08 NOTE — ED Triage Notes (Signed)
Pt c/o mid sternal chest pain x 2 days radiates to back , denies SOB

## 2016-09-08 NOTE — ED Notes (Signed)
Patient transported to X-ray 

## 2016-09-14 ENCOUNTER — Ambulatory Visit (INDEPENDENT_AMBULATORY_CARE_PROVIDER_SITE_OTHER): Payer: BLUE CROSS/BLUE SHIELD | Admitting: Physician Assistant

## 2016-09-14 ENCOUNTER — Encounter: Payer: Self-pay | Admitting: Physician Assistant

## 2016-09-14 VITALS — BP 112/76 | HR 67 | Ht 65.0 in | Wt 178.0 lb

## 2016-09-14 DIAGNOSIS — R638 Other symptoms and signs concerning food and fluid intake: Secondary | ICD-10-CM

## 2016-09-14 DIAGNOSIS — Z9049 Acquired absence of other specified parts of digestive tract: Secondary | ICD-10-CM

## 2016-09-14 DIAGNOSIS — L299 Pruritus, unspecified: Secondary | ICD-10-CM

## 2016-09-14 DIAGNOSIS — R1011 Right upper quadrant pain: Secondary | ICD-10-CM | POA: Diagnosis not present

## 2016-09-14 DIAGNOSIS — R630 Anorexia: Secondary | ICD-10-CM | POA: Diagnosis not present

## 2016-09-14 LAB — CBC WITH DIFFERENTIAL/PLATELET
BASOS PCT: 0 %
Basophils Absolute: 0 cells/uL (ref 0–200)
EOS ABS: 372 {cells}/uL (ref 15–500)
Eosinophils Relative: 4 %
HEMATOCRIT: 44.7 % (ref 35.0–45.0)
HEMOGLOBIN: 14.8 g/dL (ref 11.7–15.5)
LYMPHS ABS: 2604 {cells}/uL (ref 850–3900)
Lymphocytes Relative: 28 %
MCH: 29.2 pg (ref 27.0–33.0)
MCHC: 33.1 g/dL (ref 32.0–36.0)
MCV: 88.3 fL (ref 80.0–100.0)
MONO ABS: 465 {cells}/uL (ref 200–950)
MPV: 10.6 fL (ref 7.5–12.5)
Monocytes Relative: 5 %
NEUTROS ABS: 5859 {cells}/uL (ref 1500–7800)
Neutrophils Relative %: 63 %
Platelets: 268 10*3/uL (ref 140–400)
RBC: 5.06 MIL/uL (ref 3.80–5.10)
RDW: 13.8 % (ref 11.0–15.0)
WBC: 9.3 10*3/uL (ref 3.8–10.8)

## 2016-09-14 LAB — BASIC METABOLIC PANEL
BUN: 9 mg/dL (ref 7–25)
CHLORIDE: 104 mmol/L (ref 98–110)
CO2: 27 mmol/L (ref 20–31)
CREATININE: 0.85 mg/dL (ref 0.50–1.10)
Calcium: 9.2 mg/dL (ref 8.6–10.2)
Glucose, Bld: 89 mg/dL (ref 65–99)
POTASSIUM: 4.5 mmol/L (ref 3.5–5.3)
Sodium: 138 mmol/L (ref 135–146)

## 2016-09-14 MED ORDER — HYDROXYZINE HCL 25 MG PO TABS: 25.0000 mg | ORAL_TABLET | Freq: Four times a day (QID) | ORAL | 0 refills | Status: DC

## 2016-09-14 NOTE — Addendum Note (Signed)
Addended by: Donella Stade on: 09/14/2016 10:09 PM   Modules accepted: Orders

## 2016-09-14 NOTE — Progress Notes (Addendum)
Subjective:    Patient ID: Lisa Weeks, female    DOB: 1981-04-06, 35 y.o.   MRN: [id number]  HPI Patient is a 35 yo female coming to the office today with complaints of pruritis all over her body for two months that started around [place]. Patient saw Dr. Georgina Snell for this problem on November 10 of this year and was given oral prednisone for 12 days. Patient then went to the ED on November 29 of this year due to an episode of right sided chest pain. The chest pain was diagnosed as nonspecific and the patient was given Zantac and Carafate, along with hydroxyzine for the itching that she was still experincing. She reports that she has not been itching since she started the hydroxyzine which she takes every 6 hours. Patient reports that hydroxyzine makes her tired. She also reports that her urine is malodorous. Patient reports that she has been craving salt over the last few weeks. She also reports a loss of appetite. Patient denies rashes. Patient denies chest pain or shortness of breath. No fever, chills or night sweats. In ED PERC was negative. Renal function and CBC normal as well.   Past Medical History:  Diagnosis Date  . Depression   . Sinus congestion     Outpatient Encounter Prescriptions as of 09/14/2016  Medication Sig  . hydrOXYzine (ATARAX/VISTARIL) 25 MG tablet Take 1 tablet (25 mg total) by mouth every 6 (six) hours.  . ranitidine (ZANTAC) 150 MG capsule Take 1 capsule (150 mg total) by mouth daily.  . sucralfate (CARAFATE) 1 GM/10ML suspension Take 10 mLs (1 g total) by mouth 4 (four) times daily -  with meals and at bedtime.  . [DISCONTINUED] hydrOXYzine (ATARAX/VISTARIL) 25 MG tablet Take 1 tablet (25 mg total) by mouth every 6 (six) hours.   No facility-administered encounter medications on file as of 09/14/2016.    Social History   Social History  . Marital status: Married    Spouse name: N/A  . Number of children: N/A  . Years of education: N/A   Occupational  History  . Not on file.   Social History Main Topics  . Smoking status: Current Every Day Smoker    Packs/day: 1.00    Years: 10.00  . Smokeless tobacco: Not on file  . Alcohol use Yes     Comment: occ  . Drug use: No  . Sexual activity: Yes   Other Topics Concern  . Not on file   Social History Narrative  . No narrative on file   Family History  Problem Relation Age of Onset  . Cancer Mother   . Diabetes Mother   . Hyperlipidemia Mother   . Hypertension Mother   . Alcohol abuse Father   . Cancer Father   . Diabetes Father   . Hypertension Father   . Hyperlipidemia Maternal Grandmother   . Hypertension Maternal Grandmother   . Cancer Maternal Grandfather   . Cancer Paternal Grandmother   . Hyperlipidemia Paternal Grandmother   . Hypertension Paternal Grandmother   . Alcohol abuse Paternal Grandfather   . Cancer Paternal Grandfather   . Heart attack Paternal Grandfather    Review of Systems  Constitutional: Positive for appetite change and fatigue. Negative for fever and unexpected weight change.  Respiratory: Negative for cough, chest tightness and shortness of breath.   Cardiovascular: Negative for chest pain, palpitations and leg swelling.  Gastrointestinal: Negative for abdominal pain, constipation, diarrhea and nausea.  Endocrine: Negative for polydipsia  and polyuria.  Genitourinary: Negative for dysuria, frequency and vaginal discharge.  Neurological: Negative for dizziness, weakness and light-headedness.       Objective: Blood pressure 112/76, pulse 67, height 5\' 5"  (1.651 m), weight 178 lb (80.7 kg), last menstrual period 08/25/2016.   Physical Exam  Constitutional: She is oriented to person, place, and time. She appears well-developed and well-nourished. No distress.  Neck: Neck supple. No thyromegaly present.  Cardiovascular: Normal rate, regular rhythm and normal heart sounds.  Exam reveals no gallop and no friction rub.   No murmur  heard. Pulmonary/Chest: Effort normal and breath sounds normal. She has no wheezes. She has no rales.  Abdominal: Soft. She exhibits no distension and no mass. There is no tenderness. There is no rebound and no guarding.  Musculoskeletal: She exhibits no edema.  Neurological: She is alert and oriented to person, place, and time.    Assessment & Plan:    Assessment   1. Itching, unknown etiology  2. Salt Craving  3.RUQ abdominal pain 4.S/P cholecystectomy  Plan - Cortisol, urine, 24 hour to see if there is an adrenal etiology due to patient craving salt.  - CBC w/Diff/Platelet to check WBC count for possible elevation since patient's WBC count was 11.8 six days ago in ED. Likely due to steriod pt was on.  -  Basic metabolic panel to check kidney function and electrolytes due to patient's symptoms of itching.  -CT scan ordered to fully evaluate RUQ pain/itching -  Refilled hydrOXYzine (ATARAX/VISTARIL) 25 MG tablet. Advised patient to not take hydroxyzine and see if patient starts itching again. Patient reported that she had not experienced itching since being on hydroxyzine, so I would like to see if the itching still persists without hydroxyzine

## 2016-09-14 NOTE — Progress Notes (Signed)
Labs are perfect.  Hows itching without hydroxyzine?

## 2016-09-14 NOTE — Patient Instructions (Addendum)
Will get CT scan order. Start tapering off medications and see if symptoms reoccur.

## 2016-09-15 ENCOUNTER — Other Ambulatory Visit: Payer: Self-pay | Admitting: Physician Assistant

## 2016-09-15 ENCOUNTER — Ambulatory Visit (INDEPENDENT_AMBULATORY_CARE_PROVIDER_SITE_OTHER): Payer: BLUE CROSS/BLUE SHIELD

## 2016-09-15 DIAGNOSIS — Z9049 Acquired absence of other specified parts of digestive tract: Secondary | ICD-10-CM

## 2016-09-15 DIAGNOSIS — R638 Other symptoms and signs concerning food and fluid intake: Secondary | ICD-10-CM

## 2016-09-15 DIAGNOSIS — L299 Pruritus, unspecified: Secondary | ICD-10-CM

## 2016-09-15 DIAGNOSIS — R1011 Right upper quadrant pain: Secondary | ICD-10-CM

## 2016-09-15 DIAGNOSIS — R630 Anorexia: Secondary | ICD-10-CM

## 2016-09-15 MED ORDER — IOPAMIDOL (ISOVUE-300) INJECTION 61%: 100.0000 mL | Freq: Once | INTRAVENOUS | Status: DC | PRN

## 2016-09-15 NOTE — Progress Notes (Signed)
Call pt: CT of abdomen no acute findings. No masses.  Non-obstructing stones in kidneys.

## 2016-09-24 LAB — CORTISOL, URINE, 24 HOUR
CORTISOL (UR), FREE: 21.7 ug/(24.h) (ref 4.0–50.0)
RESULTS RECEIVED: 1.53 g/(24.h) (ref 0.63–2.50)

## 2016-09-28 NOTE — Progress Notes (Signed)
Do you want to go to allergist? For itching?

## 2016-09-29 ENCOUNTER — Other Ambulatory Visit: Payer: Self-pay | Admitting: Physician Assistant

## 2016-09-29 DIAGNOSIS — L299 Pruritus, unspecified: Secondary | ICD-10-CM

## 2016-10-26 ENCOUNTER — Encounter: Payer: Self-pay | Admitting: Pediatrics

## 2016-10-26 ENCOUNTER — Ambulatory Visit (INDEPENDENT_AMBULATORY_CARE_PROVIDER_SITE_OTHER): Payer: BLUE CROSS/BLUE SHIELD | Admitting: Pediatrics

## 2016-10-26 VITALS — BP 120/70 | HR 60 | Temp 98.7°F | Resp 16 | Ht 64.0 in | Wt 178.8 lb

## 2016-10-26 DIAGNOSIS — L500 Allergic urticaria: Secondary | ICD-10-CM

## 2016-10-26 DIAGNOSIS — J302 Other seasonal allergic rhinitis: Secondary | ICD-10-CM | POA: Diagnosis not present

## 2016-10-26 DIAGNOSIS — L503 Dermatographic urticaria: Secondary | ICD-10-CM

## 2016-10-26 MED ORDER — RANITIDINE HCL 150 MG PO TABS: 150.0000 mg | ORAL_TABLET | Freq: Two times a day (BID) | ORAL | 5 refills | Status: DC

## 2016-10-26 NOTE — Patient Instructions (Addendum)
Environmental control of dust mite Claritin 10 mg in the morning and Zyrtec 10 mg at night for itching Ranitidine 150 mg-take 1 tablet every 12 hours for heartburn. It may help the itching Do foods with salicylates make you itch? Call me if you're not doing better on this treatment plan

## 2016-10-26 NOTE — Progress Notes (Signed)
[place] [address] Dept: [phone]  New Patient Note  Patient ID: Lisa Weeks, female    DOB: 11/24/1980  Age: 36 y.o. MRN: [id number] Date of Office Visit: 10/26/2016 Referring provider: Donella Stade, PA-C [address] [place], [place] [address]    Chief Complaint: Pruritus (x months) and Angioedema (hands)  HPI Lisa Weeks presents for  evaluation of itching of her skin since August 2017. The itching has been occurring almost daily. She takes Benadryl at night. If she does not take Benadryl , sometimes she has swelling of her hands. She has not had swelling of her lips, eyes or throat. She had her gallbladder removed in July 2017. There are no clearcut precipitants to her hives. She has never had eczema, or asthma. She has had rhinitis at times , particularly in the springtime. In October 2017 she had an unremarkable CBC with differential, normal complete metabolic panel and normal TSH  Review of Systems  Constitutional: Negative.   HENT:       Postnasal drainage at times  Eyes: Negative.   Respiratory: Negative.   Cardiovascular: Negative.   Gastrointestinal:       Heartburn. Cholecystectomy. Hives from Prilosec  Genitourinary: Negative.   Musculoskeletal: Negative.   Skin:       Itchy skin and a  rash since August 2017  Neurological: Negative.   Endo/Heme/Allergies:       No diabetes or thyroid disease  Psychiatric/Behavioral: Negative.     Outpatient Encounter Prescriptions as of 10/26/2016  Medication Sig  . diphenhydrAMINE (BENADRYL) 25 MG tablet Take 25 mg by mouth every 6 (six) hours as needed.  . ranitidine (ZANTAC) 150 MG tablet Take 1 tablet (150 mg total) by mouth every 12 (twelve) hours.  . [DISCONTINUED] hydrOXYzine (ATARAX/VISTARIL) 25 MG tablet Take 1 tablet (25 mg total) by mouth every 6 (six) hours. (Patient not taking: Reported on 10/26/2016)  . [DISCONTINUED] ranitidine (ZANTAC) 150 MG capsule Take 1 capsule  (150 mg total) by mouth daily. (Patient not taking: Reported on 10/26/2016)  . [DISCONTINUED] sucralfate (CARAFATE) 1 GM/10ML suspension Take 10 mLs (1 g total) by mouth 4 (four) times daily -  with meals and at bedtime.   No facility-administered encounter medications on file as of 10/26/2016.      Drug Allergies:  Allergies  Allergen Reactions  . Prilosec [Omeprazole] Hives    Family History: [place]'s family history includes Alcohol abuse in her father and paternal grandfather; Allergic rhinitis in her father and mother; Bronchitis in her sister; Cancer in her father, maternal grandfather, mother, paternal grandfather, and paternal grandmother; Diabetes in her father and mother; Heart attack in her paternal grandfather; Hyperlipidemia in her maternal grandmother, mother, and paternal grandmother; Hypertension in her father, maternal grandmother, mother, and paternal grandmother.. There is no family history of asthma, hayfever, angioedema, eczema, hives, food allergies or lupus.  Social and environmental. There is a  dog in the home. There is no smoking inside the house. She quit smoking cigarettes 2 weeks ago after smoking about a pack of  cigarettes per day for 10 years. 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  Physical Exam: BP 120/70   Pulse 60   Temp 98.7 F (37.1 C) (Oral)   Resp 16   Ht 5\' 4"  (1.626 m)   Wt 178 lb 12.8 oz (81.1 kg)   BMI 30.69 kg/m    Physical Exam  Constitutional: She is oriented to person, place, and time. She  appears well-developed and well-nourished.  HENT:  Eyes normal. Ears normal. Nose normal. Pharynx normal.  Neck: Neck supple. No thyromegaly present.  Cardiovascular:  S1 and S2 normal no murmurs  Pulmonary/Chest:  Clear to percussion and auscultation  Abdominal: Soft. There is no tenderness (no hepatosplenomegaly).  Lymphadenopathy:    She has no cervical adenopathy.  Neurological: She is alert and oriented to person, place, and time.  Skin:  Clear but  there was dermographia  Psychiatric: She has a normal mood and affect. Her behavior is normal. Judgment and thought content normal.  Vitals reviewed.   Diagnostics: Allergy skin tests were positive to grass pollens and dust mites. Skin testing to foods was negative   Assessment  Assessment and Plan: 1. Allergic urticaria   2. Other seasonal allergic rhinitis   3. Dermographia     Meds ordered this encounter  Medications  . ranitidine (ZANTAC) 150 MG tablet    Sig: Take 1 tablet (150 mg total) by mouth every 12 (twelve) hours.    Dispense:  60 tablet    Refill:  5    Patient Instructions  Environmental control of dust mite Claritin 10 mg in the morning and Zyrtec 10 mg at night for itching Ranitidine 150 mg-take 1 tablet every 12 hours for heartburn. It may help the itching Do foods with salicylates make you itch? Call me if you're not doing better on this treatment plan   Return in about 4 weeks (around 11/23/2016).   Thank you for the opportunity to care for this patient.  Please do not hesitate to contact me with questions.  Penne Lash, M.D.  [facility] [address] [place], [place] [address] [phone]

## 2016-10-29 DIAGNOSIS — J302 Other seasonal allergic rhinitis: Secondary | ICD-10-CM | POA: Insufficient documentation

## 2016-10-29 DIAGNOSIS — L500 Allergic urticaria: Secondary | ICD-10-CM | POA: Insufficient documentation

## 2016-10-29 DIAGNOSIS — L503 Dermatographic urticaria: Secondary | ICD-10-CM | POA: Insufficient documentation

## 2016-11-23 ENCOUNTER — Ambulatory Visit: Payer: BLUE CROSS/BLUE SHIELD | Admitting: Pediatrics

## 2017-08-23 ENCOUNTER — Ambulatory Visit: Payer: BLUE CROSS/BLUE SHIELD | Admitting: Family Medicine

## 2017-08-23 ENCOUNTER — Encounter: Payer: Self-pay | Admitting: Family Medicine

## 2017-08-23 VITALS — BP 120/78 | Ht 65.0 in | Wt 177.0 lb

## 2017-08-23 DIAGNOSIS — Z23 Encounter for immunization: Secondary | ICD-10-CM | POA: Diagnosis not present

## 2017-08-23 DIAGNOSIS — R300 Dysuria: Secondary | ICD-10-CM

## 2017-08-23 LAB — POCT URINALYSIS DIPSTICK
BILIRUBIN UA: NEGATIVE
GLUCOSE UA: NEGATIVE
KETONES UA: NEGATIVE
Leukocytes, UA: NEGATIVE
NITRITE UA: POSITIVE
Protein, UA: NEGATIVE
RBC UA: NEGATIVE
Spec Grav, UA: 1.015 (ref 1.010–1.025)
Urobilinogen, UA: 0.2 E.U./dL
pH, UA: 5.5 (ref 5.0–8.0)

## 2017-08-23 MED ORDER — NITROFURANTOIN MONOHYD MACRO 100 MG PO CAPS: 100.0000 mg | ORAL_CAPSULE | Freq: Two times a day (BID) | ORAL | 0 refills | Status: DC

## 2017-08-23 NOTE — Patient Instructions (Signed)
Thank you for coming in today. Take macrobid twice daily for 5 days.  Recheck if not better.   If your belly pain worsens, or you have high fever, bad vomiting, blood in your stool or black tarry stool go to the Emergency Room.   Nitrofurantoin tablets or capsules What is this medicine? NITROFURANTOIN (nye troe fyoor AN toyn) is an antibiotic. It is used to treat urinary tract infections. This medicine may be used for other purposes; ask your health care provider or pharmacist if you have questions. COMMON BRAND NAME(S): Macrobid, Macrodantin, Urotoin What should I tell my health care provider before I take this medicine? They need to know if you have any of these conditions: -anemia -diabetes -glucose-6-phosphate dehydrogenase deficiency -kidney disease -liver disease -lung disease -other chronic illness -an unusual or allergic reaction to nitrofurantoin, other antibiotics, other medicines, foods, dyes or preservatives -pregnant or trying to get pregnant -breast-feeding How should I use this medicine? Take this medicine by mouth with a glass of water. Follow the directions on the prescription label. Take this medicine with food or milk. Take your doses at regular intervals. Do not take your medicine more often than directed. Do not stop taking except on your doctor's advice. Talk to your pediatrician regarding the use of this medicine in children. While this drug may be prescribed for selected conditions, precautions do apply. Overdosage: If you think you have taken too much of this medicine contact a poison control center or emergency room at once. NOTE: This medicine is only for you. Do not share this medicine with others. What if I miss a dose? If you miss a dose, take it as soon as you can. If it is almost time for your next dose, take only that dose. Do not take double or extra doses. What may interact with this medicine? -antacids containing magnesium  trisilicate -probenecid -quinolone antibiotics like ciprofloxacin, lomefloxacin, norfloxacin and ofloxacin -sulfinpyrazone This list may not describe all possible interactions. Give your health care provider a list of all the medicines, herbs, non-prescription drugs, or dietary supplements you use. Also tell them if you smoke, drink alcohol, or use illegal drugs. Some items may interact with your medicine. What should I watch for while using this medicine? Tell your doctor or health care professional if your symptoms do not improve or if you get new symptoms. Drink several glasses of water a day. If you are taking this medicine for a long time, visit your doctor for regular checks on your progress. If you are diabetic, you may get a false positive result for sugar in your urine with certain brands of urine tests. Check with your doctor. What side effects may I notice from receiving this medicine? Side effects that you should report to your doctor or health care professional as soon as possible: -allergic reactions like skin rash or hives, swelling of the face, lips, or tongue -chest pain -cough -difficulty breathing -dizziness, drowsiness -fever or infection -joint aches or pains -pale or blue-tinted skin -redness, blistering, peeling or loosening of the skin, including inside the mouth -tingling, burning, pain, or numbness in hands or feet -unusual bleeding or bruising -unusually weak or tired -yellowing of eyes or skin Side effects that usually do not require medical attention (report to your doctor or health care professional if they continue or are bothersome): -dark urine -diarrhea -headache -loss of appetite -nausea or vomiting -temporary hair loss This list may not describe all possible side effects. Call your doctor for medical advice  about side effects. You may report side effects to FDA at 1-800-FDA-1088. Where should I keep my medicine? Keep out of the reach of  children. Store at room temperature between 15 and 30 degrees C (59 and 86 degrees F). Protect from light. Throw away any unused medicine after the expiration date. NOTE: This sheet is a summary. It may not cover all possible information. If you have questions about this medicine, talk to your doctor, pharmacist, or health care provider.  2018 Elsevier/Gold Standard (2008-04-17 15:56:47)

## 2017-08-23 NOTE — Progress Notes (Signed)
Adilee Lemme is a 36 y.o. female who presents to [facility] today for 3 days history of dysuria, increased frequency, and suprapubic pain. Pain is worse on the left side than the right, but worst at the midline. Patient has not had fever, flank pain, and does not have CVA tenderness. Patient does not have vaginal discharge or irritation. She denies gastrointestinal symptoms, no diarrhea or constipation. She did take AZO on Saturday which provided some relief.   She has had UTIs in the past, and this feels similar. She is not concerned that she could be pregnant or have an STI.    Past Medical History:  Diagnosis Date  . Depression   . Sinus congestion    Past Surgical History:  Procedure Laterality Date  . WISDOM TOOTH EXTRACTION  2008   Social History   Tobacco Use  . Smoking status: Former Smoker    Packs/day: 1.00    Years: 10.00    Pack years: 10.00    Last attempt to quit: 10/11/2016    Years since quitting: 0.8  . Smokeless tobacco: Never Used  Substance Use Topics  . Alcohol use: No   family history includes Alcohol abuse in her father and paternal grandfather; Allergic rhinitis in her father and mother; Bronchitis in her sister; Cancer in her father, maternal grandfather, mother, paternal grandfather, and paternal grandmother; Diabetes in her father and mother; Heart attack in her paternal grandfather; Hyperlipidemia in her maternal grandmother, mother, and paternal grandmother; Hypertension in her father, maternal grandmother, mother, and paternal grandmother.  ROS as above:  Medications: Current Outpatient Medications  Medication Sig Dispense Refill  . diphenhydrAMINE (BENADRYL) 25 MG tablet Take 25 mg by mouth every 6 (six) hours as needed.    . nitrofurantoin, macrocrystal-monohydrate, (MACROBID) 100 MG capsule Take 1 capsule (100 mg total) 2  (two) times daily by mouth. 10 capsule 0  . ranitidine (ZANTAC) 150 MG tablet Take 1 tablet (150 mg total) by mouth every 12 (twelve) hours. 60 tablet 5   No current facility-administered medications for this visit.    Allergies  Allergen Reactions  . Prilosec [Omeprazole] Hives    Health Maintenance Health Maintenance  Topic Date Due  . HIV Screening  03/03/1997  . PAP SMEAR  03/04/2003  . INFLUENZA VACCINE  05/11/2017  . TETANUS/TDAP  07/26/2026     Exam:  BP 120/78   Ht 5\' 5"  (1.651 m)   Wt 177 lb (80.3 kg)   BMI 29.45 kg/m  Gen: Well NAD HEENT: EOMI,  MMM Lungs: Normal work of breathing. CTABL Heart: RRR no MRG Abd: NABS, Soft. Nondistended, Tender to palpation over bladder and minimally tender over LLQ. No rebound tenderness or guarding.  Back: No CVA tenderness Exts: Brisk capillary refill, warm and well perfused.    Results for orders placed or performed in visit on 08/23/17 (from the past 72 hour(s))  POCT Urinalysis Dipstick     Status: None   Collection Time: 08/23/17 10:16 AM  Result Value Ref Range   Color, UA orange    Clarity, UA clear    Glucose, UA neg    Bilirubin, UA neg    Ketones, UA neg    Spec Grav, UA 1.015 1.010 - 1.025   Blood, UA neg    pH, UA 5.5 5.0 - 8.0   Protein, UA neg    Urobilinogen, UA 0.2 0.2 or 1.0 E.U./dL   Nitrite, UA  pos    Leukocytes, UA Negative Negative   No results found.  Assessment and Plan: 36 y.o. female with 3 days history of dysuria and suprapubic pain most concerning for uncomplicated cystitis. Patient does not have symptoms concerning for pyelonephritis or STI. She does not have vaginal discharge or discomfort.   Will treat with nitrofurantoin 100 mg BID for 5 days. Patient will return to follow up if she does not improve.  In terms of health maintenance, patient has not had Pap in many years. She will schedule Pap and regular health maintenance exam today.   Flu vaccine given prior to D/C  Orders  Placed This Encounter  Procedures  . Flu Vaccine QUAD 36+ mos IM (Fluarix & Fluzone Quad PF  . POCT Urinalysis Dipstick   Meds ordered this encounter  Medications  . nitrofurantoin, macrocrystal-monohydrate, (MACROBID) 100 MG capsule    Sig: Take 1 capsule (100 mg total) 2 (two) times daily by mouth.    Dispense:  10 capsule    Refill:  0   Discussed warning signs or symptoms. Please see discharge instructions. Patient expresses understanding.

## 2017-09-06 ENCOUNTER — Encounter: Payer: Self-pay | Admitting: Physician Assistant

## 2017-09-06 ENCOUNTER — Ambulatory Visit (INDEPENDENT_AMBULATORY_CARE_PROVIDER_SITE_OTHER): Payer: BLUE CROSS/BLUE SHIELD | Admitting: Physician Assistant

## 2017-09-06 ENCOUNTER — Ambulatory Visit (INDEPENDENT_AMBULATORY_CARE_PROVIDER_SITE_OTHER): Payer: BLUE CROSS/BLUE SHIELD

## 2017-09-06 VITALS — BP 124/60 | HR 66 | Wt 176.0 lb

## 2017-09-06 DIAGNOSIS — R1013 Epigastric pain: Secondary | ICD-10-CM

## 2017-09-06 DIAGNOSIS — M1288 Other specific arthropathies, not elsewhere classified, other specified site: Secondary | ICD-10-CM

## 2017-09-06 DIAGNOSIS — R39859 Costovertebral (angle) tenderness, unspecified side: Secondary | ICD-10-CM

## 2017-09-06 DIAGNOSIS — R1011 Right upper quadrant pain: Secondary | ICD-10-CM | POA: Diagnosis not present

## 2017-09-06 DIAGNOSIS — R0789 Other chest pain: Secondary | ICD-10-CM

## 2017-09-06 DIAGNOSIS — R110 Nausea: Secondary | ICD-10-CM

## 2017-09-06 DIAGNOSIS — M549 Dorsalgia, unspecified: Secondary | ICD-10-CM | POA: Diagnosis not present

## 2017-09-06 DIAGNOSIS — Z9049 Acquired absence of other specified parts of digestive tract: Secondary | ICD-10-CM | POA: Diagnosis not present

## 2017-09-06 DIAGNOSIS — N200 Calculus of kidney: Secondary | ICD-10-CM | POA: Diagnosis not present

## 2017-09-06 MED ORDER — RANITIDINE HCL 150 MG PO TABS: 150.0000 mg | ORAL_TABLET | Freq: Two times a day (BID) | ORAL | 5 refills | Status: AC

## 2017-09-06 MED ORDER — ONDANSETRON HCL 4 MG PO TABS: 4.0000 mg | ORAL_TABLET | Freq: Three times a day (TID) | ORAL | 0 refills | Status: DC | PRN

## 2017-09-06 NOTE — Progress Notes (Signed)
FYI done. Discussed with patient no cause found for RUQ pain. Will treat GERD symptoms and go ahead and make GI referral. Follow up with any change in symptoms.

## 2017-09-06 NOTE — Progress Notes (Signed)
Subjective:    Patient ID: Lisa Weeks, female    DOB: 01-Jul-1981, 36 y.o.   MRN: [id number]  HPI  Pt is a 36 yo female who presents to the clinic with RUQ pain that radiates into middle to left chest and into right mid back. Symptoms started last night before bed. Pt is status post-cholecystectomy in 04/2016. She feels like she did before she had her gallbladder removed. She did try zantac last night with no relief. She is having normal bowel movements with no changes. Stools are soft. She is nauseated but no vomiting. No fever, chills. Pt does have hx of kidney stones. She denies any dysuria or increase in urinary frequency. She recently finished macrobid for UTI. Pt does not feel like this is acid reflux.   .. Active Ambulatory Problems    Diagnosis Date Noted  . Depression 06/27/2015  . Numbness and tingling 09/19/2015  . Other fatigue 09/19/2015  . Gastroesophageal reflux disease with esophagitis 01/07/2016  . Gallstones without obstruction of gallbladder 02/27/2016  . Hair loss 07/14/2016  . Seborrheic keratosis 07/14/2016  . Anti-TPO antibodies present 07/18/2016  . Itching 08/20/2016  . Dermographia 10/29/2016  . Other seasonal allergic rhinitis 10/29/2016  . Allergic urticaria 10/29/2016  . CVA tenderness 09/06/2017  . Right upper quadrant pain 09/06/2017  . Atypical chest pain 09/06/2017   Resolved Ambulatory Problems    Diagnosis Date Noted  . Broken nose 01/23/2015  . Atypical nevus 07/14/2016   Past Medical History:  Diagnosis Date  . Depression   . Sinus congestion      Review of Systems  All other systems reviewed and are negative.      Objective:   Physical Exam  Constitutional: She is oriented to person, place, and time. She appears well-developed and well-nourished.  HENT:  Head: Normocephalic and atraumatic.  Cardiovascular: Normal rate, regular rhythm and normal heart sounds.  Pulmonary/Chest: Effort normal and breath sounds normal. She has no  wheezes. She exhibits no tenderness.  Right sided CVA tenderness.   Abdominal: Soft. Bowel sounds are normal. She exhibits no distension and no mass. There is tenderness. There is guarding. There is no rebound.  RUQ into epigastric area moderate tenderness with faint guarding.   Neurological: She is alert and oriented to person, place, and time.  Psychiatric: She has a normal mood and affect. Her behavior is normal.          Assessment & Plan:  Marland KitchenMarland KitchenAdiva was seen today for chest pain.  Diagnoses and all orders for this visit:  Right upper quadrant pain -     CT Abdomen Pelvis Wo Contrast; Future -     CT Abdomen Pelvis Wo Contrast -     COMPLETE METABOLIC PANEL WITH GFR -     CBC with Differential/Platelet -     Lipase -     ranitidine (ZANTAC) 150 MG tablet; Take 1 tablet (150 mg total) by mouth every 12 (twelve) hours.  Atypical chest pain -     EKG 12-Lead -     COMPLETE METABOLIC PANEL WITH GFR -     CBC with Differential/Platelet -     Lipase -     ranitidine (ZANTAC) 150 MG tablet; Take 1 tablet (150 mg total) by mouth every 12 (twelve) hours.  CVA tenderness -     COMPLETE METABOLIC PANEL WITH GFR -     CBC with Differential/Platelet -     Lipase  Epigastric pain -  CT Abdomen Pelvis Wo Contrast; Future -     CT Abdomen Pelvis Wo Contrast -     COMPLETE METABOLIC PANEL WITH GFR -     CBC with Differential/Platelet -     Lipase -     ranitidine (ZANTAC) 150 MG tablet; Take 1 tablet (150 mg total) by mouth every 12 (twelve) hours.  Nausea -     ondansetron (ZOFRAN) 4 MG tablet; Take 1 tablet (4 mg total) by mouth every 8 (eight) hours as needed for nausea or vomiting.  unclear etiology EKG looked great and do not feel like this is cardiac in nature.  concern for bililary stone vs kidney stones.   EKG- NSR at 52. No acute ST elevation or depression. No arrhthymias.   Ordered CBC, lipase, CMP.  CT ordered. Non obstructing kidney stones bilaterally found. No  cause of RUQ abdominal pain.  Pt called will start zantac 300mg  bid and zofran as needed. Consider probiotic. If pain worsens or changes please let us know. I will go ahead and place GI referral to consider ERCP to look for biliary stone even though not seen on  CT.   Marland Kitchen.Spent 30 minutes with patient and greater than 50 percent of visit spent counseling patient regarding treatment plan.

## 2017-09-07 LAB — CBC WITH DIFFERENTIAL/PLATELET
BASOS PCT: 1 %
Basophils Absolute: 102 cells/uL (ref 0–200)
Eosinophils Absolute: 286 cells/uL (ref 15–500)
Eosinophils Relative: 2.8 %
HCT: 42.6 % (ref 35.0–45.0)
HEMOGLOBIN: 14.4 g/dL (ref 11.7–15.5)
Lymphs Abs: 3019 cells/uL (ref 850–3900)
MCH: 29.2 pg (ref 27.0–33.0)
MCHC: 33.8 g/dL (ref 32.0–36.0)
MCV: 86.4 fL (ref 80.0–100.0)
MONOS PCT: 3.2 %
MPV: 11.2 fL (ref 7.5–12.5)
NEUTROS ABS: 6467 {cells}/uL (ref 1500–7800)
Neutrophils Relative %: 63.4 %
PLATELETS: 314 10*3/uL (ref 140–400)
RBC: 4.93 10*6/uL (ref 3.80–5.10)
RDW: 12.8 % (ref 11.0–15.0)
TOTAL LYMPHOCYTE: 29.6 %
WBC: 10.2 10*3/uL (ref 3.8–10.8)
WBCMIX: 326 {cells}/uL (ref 200–950)

## 2017-09-07 LAB — COMPLETE METABOLIC PANEL WITH GFR
AG RATIO: 1.6 (calc) (ref 1.0–2.5)
ALBUMIN MSPROF: 4.1 g/dL (ref 3.6–5.1)
ALKALINE PHOSPHATASE (APISO): 116 U/L — AB (ref 33–115)
ALT: 60 U/L — ABNORMAL HIGH (ref 6–29)
AST: 24 U/L (ref 10–30)
BUN: 10 mg/dL (ref 7–25)
CO2: 27 mmol/L (ref 20–32)
CREATININE: 0.82 mg/dL (ref 0.50–1.10)
Calcium: 9.1 mg/dL (ref 8.6–10.2)
Chloride: 103 mmol/L (ref 98–110)
GFR, EST NON AFRICAN AMERICAN: 93 mL/min/{1.73_m2} (ref >=60)
GFR, Est African American: 107 mL/min/{1.73_m2} (ref >=60)
GLOBULIN: 2.5 g/dL (ref 1.9–3.7)
Glucose, Bld: 130 mg/dL — ABNORMAL HIGH (ref 65–99)
POTASSIUM: 4.3 mmol/L (ref 3.5–5.3)
SODIUM: 138 mmol/L (ref 135–146)
Total Bilirubin: 0.3 mg/dL (ref 0.2–1.2)
Total Protein: 6.6 g/dL (ref 6.1–8.1)

## 2017-09-07 LAB — LIPASE: LIPASE: 20 U/L (ref 7–60)

## 2017-12-14 ENCOUNTER — Encounter: Payer: Self-pay | Admitting: Physician Assistant

## 2017-12-14 ENCOUNTER — Ambulatory Visit: Payer: BLUE CROSS/BLUE SHIELD | Admitting: Physician Assistant

## 2017-12-14 VITALS — BP 129/88 | HR 70 | Temp 98.1°F | Wt 179.0 lb

## 2017-12-14 DIAGNOSIS — J019 Acute sinusitis, unspecified: Secondary | ICD-10-CM

## 2017-12-14 DIAGNOSIS — B9689 Other specified bacterial agents as the cause of diseases classified elsewhere: Secondary | ICD-10-CM | POA: Diagnosis not present

## 2017-12-14 DIAGNOSIS — R058 Other specified cough: Secondary | ICD-10-CM

## 2017-12-14 DIAGNOSIS — R05 Cough: Secondary | ICD-10-CM | POA: Diagnosis not present

## 2017-12-14 MED ORDER — CEFUROXIME AXETIL 250 MG PO TABS: 250.0000 mg | ORAL_TABLET | Freq: Two times a day (BID) | ORAL | 0 refills | Status: AC

## 2017-12-14 MED ORDER — GUAIFENESIN ER 600 MG PO TB12: 1200.0000 mg | ORAL_TABLET | Freq: Two times a day (BID) | ORAL | Status: DC

## 2017-12-14 MED ORDER — FLUTICASONE PROPIONATE 50 MCG/ACT NA SUSP: 1.0000 | Freq: Every day | NASAL | 0 refills | Status: AC

## 2017-12-14 NOTE — Progress Notes (Signed)
HPI:                                                                Lisa Weeks is a 37 y.o. female who presents to [facility]: [facility] today for congestion and cough  Patient reports 1 month of recurrent nasal congestion and sinus pressure. Symptoms are gradually worsening. She developed a cough with purulent sputum yesterday. Denies fever, chills, dyspnea, hemoptysis, chest pain. Has been taking Advil cold & sinus without relief.   No flowsheet data found.  No flowsheet data found.    Past Medical History:  Diagnosis Date  . Depression   . Sinus congestion    Past Surgical History:  Procedure Laterality Date  . CHOLECYSTECTOMY N/A 04/14/2016   Procedure: LAPAROSCOPIC CHOLECYSTECTOMY WITH INTRAOPERATIVE CHOLANGIOGRAM;  Surgeon: Erroll Luna, MD;  Location: [facility];  Service: General;  Laterality: N/A;  . WISDOM TOOTH EXTRACTION  2008   Social History   Tobacco Use  . Smoking status: Former Smoker    Packs/day: 1.00    Years: 10.00    Pack years: 10.00    Last attempt to quit: 10/11/2016    Years since quitting: 1.1  . Smokeless tobacco: Never Used  Substance Use Topics  . Alcohol use: No   family history includes Alcohol abuse in her father and paternal grandfather; Allergic rhinitis in her father and mother; Bronchitis in her sister; Cancer in her father, maternal grandfather, mother, paternal grandfather, and paternal grandmother; Diabetes in her father and mother; Heart attack in her paternal grandfather; Hyperlipidemia in her maternal grandmother, mother, and paternal grandmother; Hypertension in her father, maternal grandmother, mother, and paternal grandmother.    ROS: negative except as noted in the HPI  Medications: Current Outpatient Medications  Medication Sig Dispense Refill  . diphenhydrAMINE (BENADRYL) 25 MG tablet Take 25 mg by mouth every 6 (six) hours as needed.    . ranitidine (ZANTAC) 150 MG tablet Take 1  tablet (150 mg total) by mouth every 12 (twelve) hours. 60 tablet 5   No current facility-administered medications for this visit.    Allergies  Allergen Reactions  . Prilosec [Omeprazole] Hives       Objective:  BP 129/88   Pulse 70   Temp 98.1 F (36.7 C) (Oral)   Wt 179 lb (81.2 kg)   BMI 29.79 kg/m  Gen:  alert, not ill-appearing, no distress, appropriate for age 87: head normocephalic without obvious abnormality, conjunctiva and cornea clear, TM's clear bilaterally, nasal mucosa edematous, oropharynx clear, moist mucous membranes, no frontal or maxillary sinus tenderness, no cervical adenopathy, neck supple, trachea midline Pulm: Normal work of breathing, normal phonation, clear to auscultation bilaterally, no wheezes, rales or rhonchi CV: Normal rate, regular rhythm, s1 and s2 distinct, no murmurs, clicks or rubs  Neuro: alert and oriented x 3, no tremor MSK: extremities atraumatic, normal gait and station Skin: intact, no rashes on exposed skin, no jaundice, no cyanosis    No results found for this or any previous visit (from the past 72 hour(s)). No results found.    Assessment and Plan: 37 y.o. female with   1. Acute bacterial sinusitis - cefUROXime (CEFTIN) 250 MG tablet; Take 1 tablet (250 mg total) by mouth 2 (two)  times daily with a meal for 10 days.  Dispense: 20 tablet; Refill: 0 - fluticasone (FLONASE) 50 MCG/ACT nasal spray; Place 1 spray into both nostrils daily.  Dispense: 16 g; Refill: 0  2. Cough with sputum - vitals reviewed and stable, SpO2 98% on RA at rest, lungs clear to auscultation. No indication for CXR - guaiFENesin (MUCINEX) 600 MG 12 hr tablet; Take 2 tablets (1,200 mg total) by mouth 2 (two) times daily.  Patient education and anticipatory guidance given Patient agrees with treatment plan Follow-up as needed if symptoms worsen or fail to improve  Darlyne Russian PA-C

## 2017-12-14 NOTE — Patient Instructions (Signed)

## 2018-07-09 IMAGING — CT CT ABD-PELV W/O CM
2 of 4 series · 16 of 46 positions shown, 18 images · non-contrast
Comparison: 09/15/2016

CLINICAL DATA: Right upper quadrant abdominal pain radiating to the
flank.

EXAM:
CT ABDOMEN AND PELVIS WITHOUT CONTRAST
TECHNIQUE: Multidetector CT imaging of the abdomen and pelvis was performed
following the standard protocol without IV contrast.

[Series 2: axial st · axial · 0.85mm/px · z∈[-440,-20]mm · 13 of 92 slices shown, 15 images]
[im 4/92  soft-tissue]
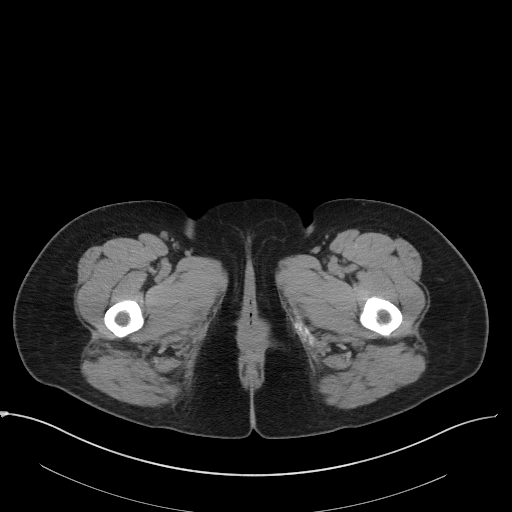
[im 4/92  bone]
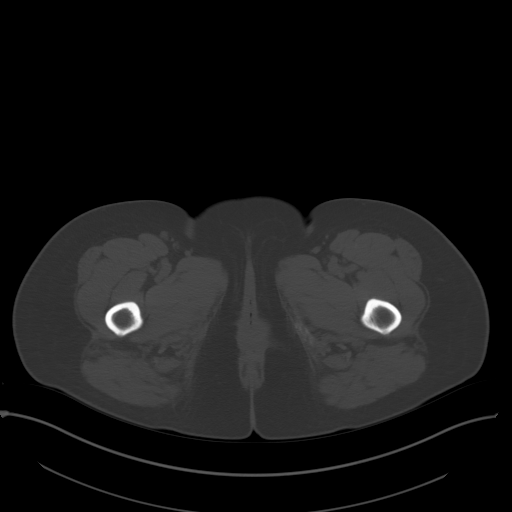
[im 12/92  soft-tissue]
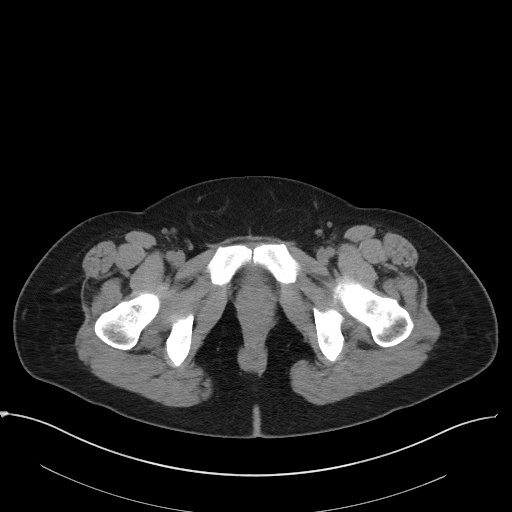
[im 19/92  soft-tissue]
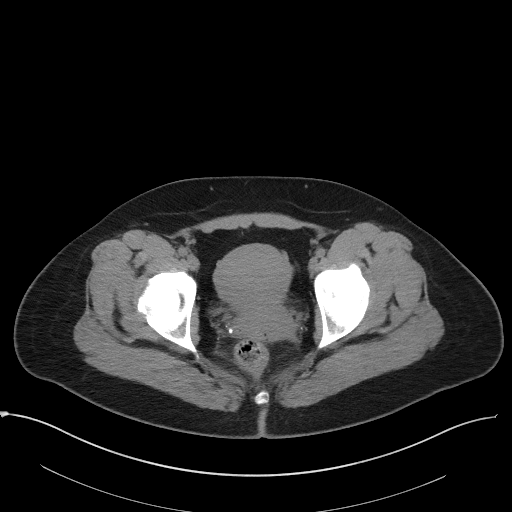
[im 27/92  soft-tissue]
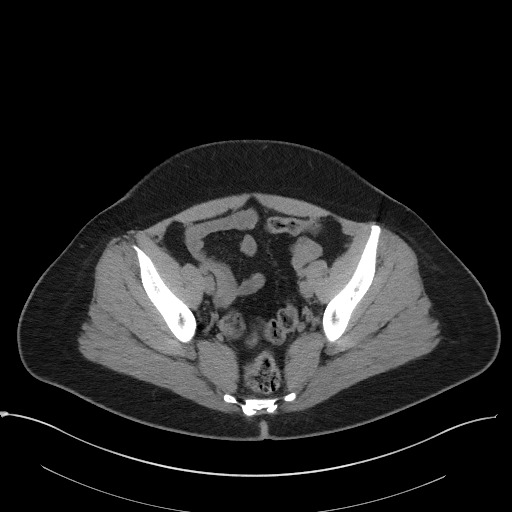
[im 31/92  soft-tissue]
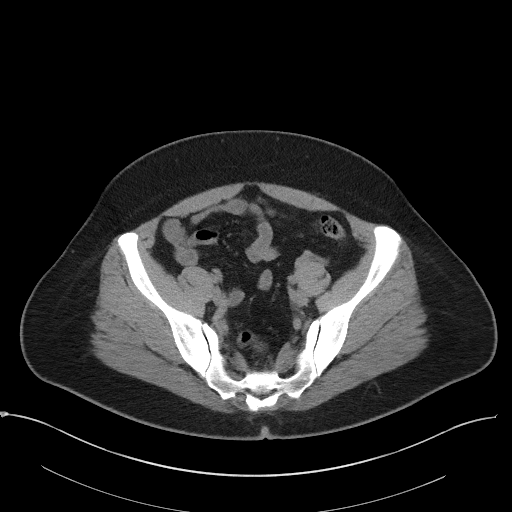
[im 38/92  soft-tissue]
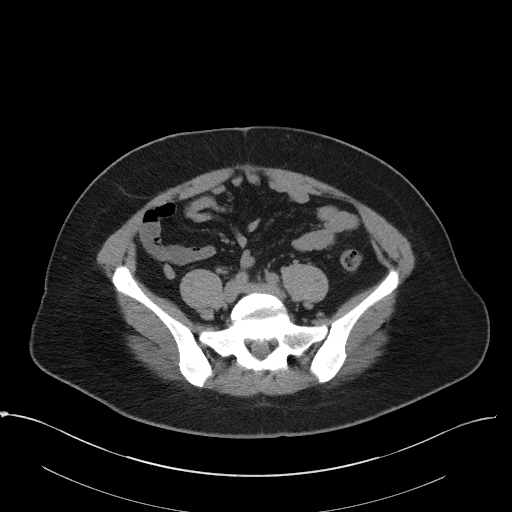
[im 46/92  soft-tissue]
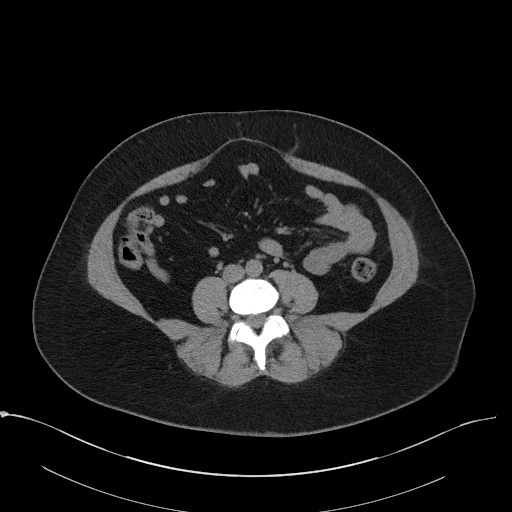
[im 54/92  soft-tissue]
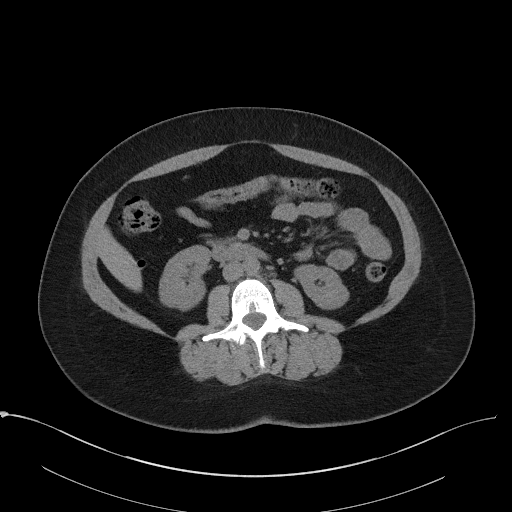
[im 61/92  soft-tissue]
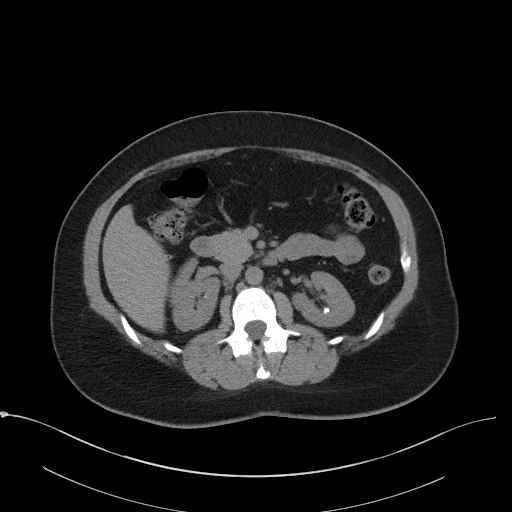
[im 61/92  bone]
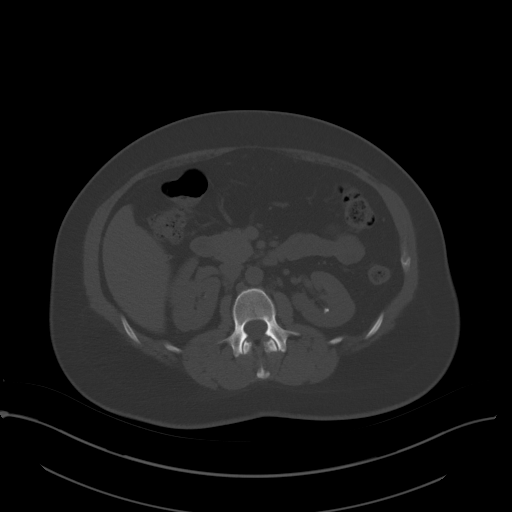
[im 65/92  soft-tissue]
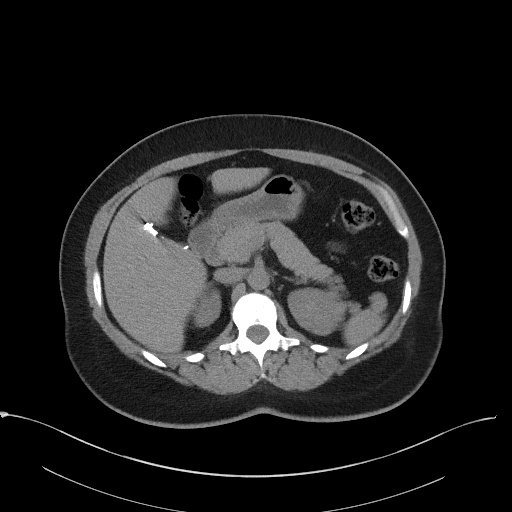
[im 73/92  soft-tissue]
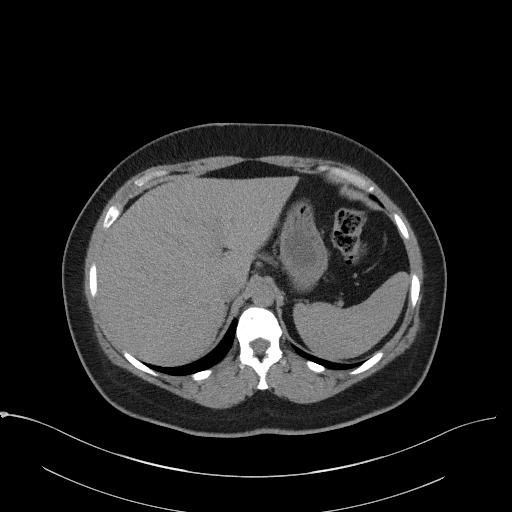
[im 80/92  soft-tissue]
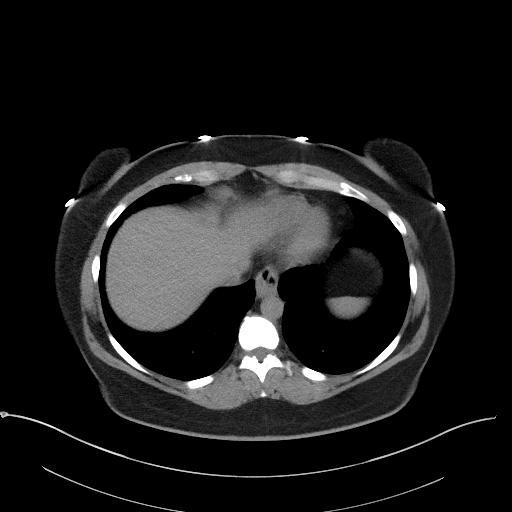
[im 88/92  soft-tissue]
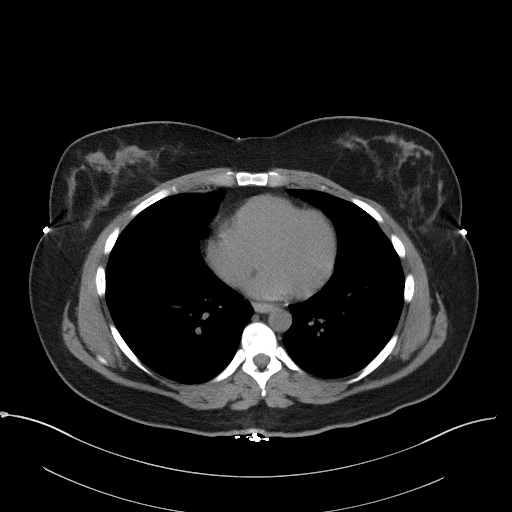

[Series 5: coronal st · coronal · 0.68mm/px · 3 of 94 slices shown]
[im 32/94  soft-tissue]
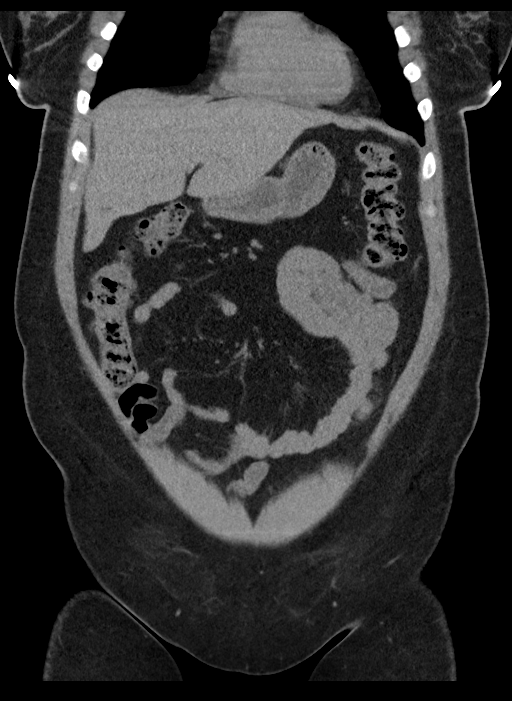
[im 42/94  soft-tissue]
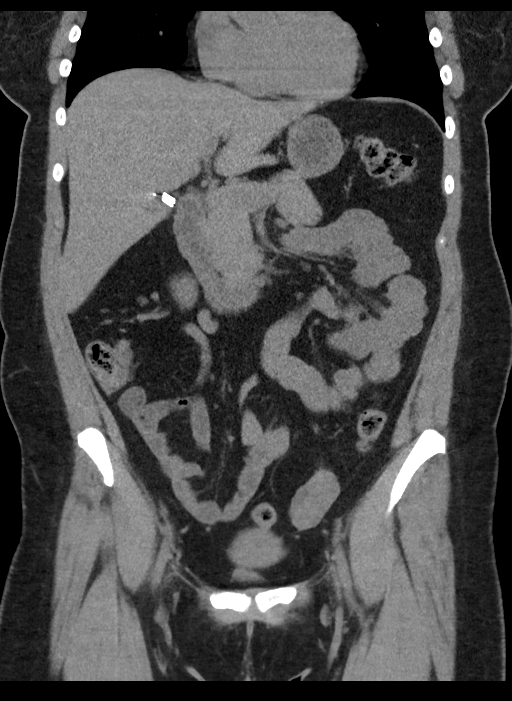
[im 52/94  soft-tissue]
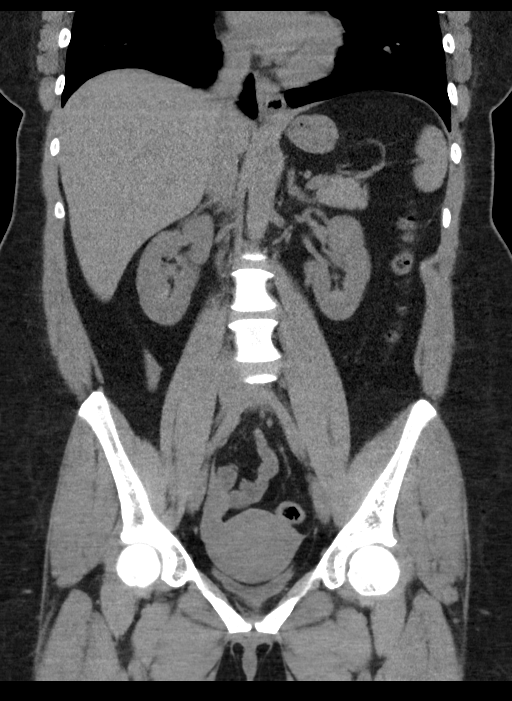

[16 of 46 positions shown; findings below may reference images not displayed]

FINDINGS: Lower chest: Unremarkable

Hepatobiliary: Cholecystectomy.  Otherwise unremarkable

Pancreas: Unremarkable

Spleen: Unremarkable

Adrenals/Urinary Tract: Adrenal glands normal.

Bilateral nonobstructive nephrolithiasis with a 2 mm right kidney
upper pole nonobstructive renal calculus, and a cluster of 2
adjacent 3 mm calculi in the left mid kidney. No hydronephrosis,
hydroureter, or additional appreciable urinary tract calculi.

Stomach/Bowel: The appendix appears normal and extends cephalad from
the cecum to track along the medial margin of the caudal right
hepatic lobe. Terminal ileum appears unremarkable as does the rest
of the bowel.

Vascular/Lymphatic: Unremarkable

Reproductive: Unremarkable

Other: No supplemental non-categorized findings.

Musculoskeletal: Mild left degenerative facet arthropathy at L5-S1.
Mild fragmented spurring of the right lateral acetabulum.
IMPRESSION: 1. A cause for the patient' s right upper quadrant abdominal pain is
not identified.
2. Bilateral nonobstructive nephrolithiasis.
3. Prior cholecystectomy.
4. Left facet arthropathy at L5-S1 without overt impingement.

## 2018-07-27 ENCOUNTER — Encounter: Payer: Self-pay | Admitting: Family Medicine

## 2018-07-27 ENCOUNTER — Ambulatory Visit (INDEPENDENT_AMBULATORY_CARE_PROVIDER_SITE_OTHER): Admitting: Family Medicine

## 2018-07-27 VITALS — BP 127/76 | HR 60 | Temp 98.6°F | Ht 65.0 in | Wt 178.0 lb

## 2018-07-27 DIAGNOSIS — J019 Acute sinusitis, unspecified: Secondary | ICD-10-CM | POA: Diagnosis not present

## 2018-07-27 DIAGNOSIS — Z23 Encounter for immunization: Secondary | ICD-10-CM | POA: Diagnosis not present

## 2018-07-27 MED ORDER — AMOXICILLIN-POT CLAVULANATE 875-125 MG PO TABS: 1.0000 | ORAL_TABLET | Freq: Two times a day (BID) | ORAL | 0 refills | Status: AC

## 2018-07-27 NOTE — Progress Notes (Signed)
   Subjective:    Patient ID: Lisa Weeks, female    DOB: 07/22/1981, 37 y.o.   MRN: [id number]  HPI 37 year old female with a history of seasonal allergic rhinitis comes in today complaining of upper respiratory symptoms x 5 day.  Planes of facial pressure with cough.  She is seeing green-colored mucus.  She denies any fever, sweats or chills. .  She is been mostly using over-the-counter cough and cold medications. She actually feels a little better today.  NO ST or ear pain   Review of Systems     Objective:   Physical Exam  Constitutional: She is oriented to person, place, and time. She appears well-developed and well-nourished.  HENT:  Head: Normocephalic and atraumatic.  Right Ear: External ear normal.  Left Ear: External ear normal.  Nose: Nose normal.  Mouth/Throat: Oropharynx is clear and moist.  TMs and canals are clear.   Eyes: Pupils are equal, round, and reactive to light. Conjunctivae and EOM are normal.  Neck: Neck supple. No thyromegaly present.  Cardiovascular: Normal rate, regular rhythm and normal heart sounds.  Pulmonary/Chest: Effort normal and breath sounds normal. She has no wheezes.  Lymphadenopathy:    She has no cervical adenopathy.  Neurological: She is alert and oriented to person, place, and time.  Skin: Skin is warm and dry.  Psychiatric: She has a normal mood and affect.       Assessment & Plan:  Acute sinusitis x5 days.  At this point she actually is feeling a little bit better so we had a discussion about symptomatic care and using nasal saline.  For now I would actually discontinue the Advil Cold and Sinus she actually felt better this morning after not having taken it last night.  I encouraged her to give it another 2 to 3 days and if at that point she still not better than okay to fill the prescription for the antibiotic that is been sent to the pharmacy.  Call if not significantly better in 10 days.  Did encourage her to schedule a CPE and pap  smear

## 2018-08-04 ENCOUNTER — Encounter: Admitting: Physician Assistant
